# Patient Record
Sex: Female | Born: 1980
Health system: Southern US, Community
[De-identification: ages and names within clinical notes are randomized; demographics above are authoritative.]

## PROBLEM LIST (undated history)

## (undated) DIAGNOSIS — F3281 Premenstrual dysphoric disorder: Secondary | ICD-10-CM

## (undated) DIAGNOSIS — R221 Localized swelling, mass and lump, neck: Secondary | ICD-10-CM

## (undated) DIAGNOSIS — N6009 Solitary cyst of unspecified breast: Secondary | ICD-10-CM

## (undated) DIAGNOSIS — Z809 Family history of malignant neoplasm, unspecified: Secondary | ICD-10-CM

## (undated) DIAGNOSIS — N63 Unspecified lump in unspecified breast: Secondary | ICD-10-CM

## (undated) DIAGNOSIS — G4710 Hypersomnia, unspecified: Secondary | ICD-10-CM

## (undated) DIAGNOSIS — Z8639 Personal history of other endocrine, nutritional and metabolic disease: Secondary | ICD-10-CM

## (undated) DIAGNOSIS — F339 Major depressive disorder, recurrent, unspecified: Secondary | ICD-10-CM

## (undated) DIAGNOSIS — L700 Acne vulgaris: Secondary | ICD-10-CM

## (undated) DIAGNOSIS — J020 Streptococcal pharyngitis: Secondary | ICD-10-CM

## (undated) DIAGNOSIS — S62309A Unspecified fracture of unspecified metacarpal bone, initial encounter for closed fracture: Secondary | ICD-10-CM

## (undated) HISTORY — DX: Unspecified fracture of unspecified metacarpal bone, initial encounter for closed fracture: S62.309A

## (undated) HISTORY — DX: Morbid (severe) obesity due to excess calories: E66.01

## (undated) HISTORY — DX: Family history of malignant neoplasm, unspecified: Z80.9

## (undated) HISTORY — DX: Personal history of other endocrine, nutritional and metabolic disease: Z86.39

## (undated) HISTORY — DX: Streptococcal pharyngitis: J02.0

## (undated) HISTORY — DX: Acne vulgaris: L70.0

## (undated) HISTORY — DX: Hypersomnia, unspecified: G47.10

## (undated) HISTORY — DX: Localized swelling, mass and lump, neck: R22.1

## (undated) HISTORY — DX: Major depressive disorder, recurrent, unspecified: F33.9

## (undated) HISTORY — DX: Solitary cyst of unspecified breast: N60.09

## (undated) HISTORY — DX: Premenstrual dysphoric disorder: F32.81

---
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
## 2005-04-03 HISTORY — PX: BREAST BIOPSY: SHX20

## 2005-07-02 DIAGNOSIS — S62309A Unspecified fracture of unspecified metacarpal bone, initial encounter for closed fracture: Secondary | ICD-10-CM

## 2005-07-02 HISTORY — DX: Unspecified fracture of unspecified metacarpal bone, initial encounter for closed fracture: S62.309A

## 2007-05-05 DIAGNOSIS — F339 Major depressive disorder, recurrent, unspecified: Secondary | ICD-10-CM

## 2007-05-05 HISTORY — DX: Major depressive disorder, recurrent, unspecified: F33.9

## 2008-05-04 DIAGNOSIS — L700 Acne vulgaris: Secondary | ICD-10-CM

## 2008-05-04 HISTORY — DX: Acne vulgaris: L70.0

## 2010-07-22 ENCOUNTER — Encounter: Payer: Self-pay | Admitting: Family Medicine

## 2010-07-22 ENCOUNTER — Ambulatory Visit (INDEPENDENT_AMBULATORY_CARE_PROVIDER_SITE_OTHER): Payer: BC Managed Care – PPO | Admitting: Family Medicine

## 2010-07-22 ENCOUNTER — Other Ambulatory Visit (HOSPITAL_COMMUNITY)
Admission: RE | Admit: 2010-07-22 | Discharge: 2010-07-22 | Disposition: A | Discharge status: Home / Self Care | Payer: BC Managed Care – PPO | Source: Ambulatory Visit | Attending: Family Medicine | Admitting: Family Medicine

## 2010-07-22 DIAGNOSIS — Z124 Encounter for screening for malignant neoplasm of cervix: Secondary | ICD-10-CM

## 2010-07-22 DIAGNOSIS — Z01419 Encounter for gynecological examination (general) (routine) without abnormal findings: Secondary | ICD-10-CM | POA: Insufficient documentation

## 2010-07-22 DIAGNOSIS — Z1159 Encounter for screening for other viral diseases: Secondary | ICD-10-CM | POA: Insufficient documentation

## 2010-07-22 DIAGNOSIS — Z0000 Encounter for general adult medical examination without abnormal findings: Secondary | ICD-10-CM

## 2010-07-22 NOTE — Progress Notes (Addendum)
30 y/o WF here to establish care.  I see her fiance, and he recently discovered a papule on his penis that could be HPV wart, so she is here to get HPV testing with pap smear. She has never had abnl pap smear.  Previous STD testing has been negative per her report today. Her menses are incomplete since getting Mirena IUD 5 yrs ago (recently got new one 04/2010), says she gets 1-2 d of spotting only, usually a few weeks in between. Has had 2 pregnancies without any complications. Review of Systems - General ROS: negative ENT ROS: negative Breast ROS: negative for breast lumps Respiratory ROS: no cough, shortness of breath, or wheezing CV: no palps, no CP, GI: no constip, diarrhea, abd pain, GERD, or bloating EYES: no vision complaints MUSC: no joint swelling, no arthralgias or myalgias SKIN: no rashes or easy bruisability GU: no vag d/c or lesion  Past Medical History  Diagnosis Date  . Breast cyst     Needle aspiration/biopsy--benign.      History reviewed. No pertinent past surgical history.  Family History  Problem Relation Age of Onset  . Leukemia Mother     smoker  . Heart disease Maternal Grandmother   . Diabetes Maternal Grandmother   . Arthritis Maternal Grandmother     rheumatoid  . Cancer Maternal Grandfather     colon/ rectal/ kidney  . COPD Maternal Grandfather     smoker  . Huntington's disease Maternal Grandfather   . Emphysema Maternal Grandfather   . Diabetes Paternal Grandmother   . Obesity Paternal Grandmother   . Cancer Brother 10    testicular    History   Social History  . Marital Status: Divorced    Spouse Name: N/A    Number of Children: N/A  . Years of Education: N/A   Occupational History  . Not on file.   Social History Main Topics  . Smoking status: Never Smoker   . Smokeless tobacco: Never Used  . Alcohol Use: Yes     occasional  . Drug Use: No  . Sexually Active: Yes -- Female partner(s)    Birth Control/ Protection: IUD   Mirena   Other Topics Concern  . Not on file   Social History Narrative  . No narrative on file    Current Outpatient Prescriptions on File Prior to Visit  Medication Sig Dispense Refill  . levonorgestrel (MIRENA) 20 MCG/24HR IUD 1 each by Intrauterine route once.          No Known Allergies  BP 131/88  Pulse 61  Temp(Src) 98.3 F (36.8 C) (Oral)  Ht 5\' 5"  (1.651 m)  Wt 251 lb 1.9 oz (113.907 kg)  BMI 41.79 kg/m2  SpO2 98%  LMP 07/03/2010  General Appearance:    Alert, cooperative, no distress, appears stated age  Head:    Normocephalic, without obvious abnormality, atraumatic  Eyes:    PERRL, conjunctiva/corneas clear, EOM's intact, fundi    benign, both eyes  Ears:    Normal TM's and external ear canals, both ears  Nose:   Nares normal, septum midline, mucosa normal, no drainage    or sinus tenderness  Throat:   Lips, mucosa, and tongue normal; teeth and gums normal  Neck:   Supple, symmetrical, trachea midline, no adenopathy;    thyroid:  no enlargement/tenderness/nodules; no carotid   bruit or JVD  Back:     Symmetric, no curvature, ROM normal, no CVA tenderness  Lungs:  Clear to auscultation bilaterally, respirations unlabored  Chest Wall:    No tenderness or deformity   Heart:    Regular rate and rhythm, S1 and S2 normal, no murmur, rub   or gallop  Breast Exam:    No tenderness, masses, or nipple abnormality  Abdomen:     Soft, non-tender, bowel sounds active all four quadrants,    no masses, no organomegaly  Genitalia:    Normal female without lesion, discharge or tenderness  Rectal:    Normal tone, normal prostate, no masses or tenderness;   guaiac negative stool  Extremities:   Extremities normal, atraumatic, no cyanosis or edema  Pulses:   2+ and symmetric all extremities  Skin:   Skin color, texture, turgor normal, no rashes or lesions  Lymph nodes:   Cervical, supraclavicular, and axillary nodes normal  Neurologic:   CNII-XII intact, normal  strength, sensation and reflexes    throughout  Pap smear obtained today.  ASSESSMENT: 1) Gen med exam with pap.  Possible exposure to HPV--pt desires HPV testing regardless of pap result.  PLAN: Discussed prudent diet, exercise. Discussed HPV epidemiology, how it relates to genital warts and cervical changes.  Answered questions. Patient will continue to do self breast exams and we'll start routine annual clinical breast exams at age 15, with annual screening mammograms at that time as well. With strong FH of AML, will do annual CBCs---will obtain old records to review date of most recent labs and get next set when it has been a year.

## 2010-07-31 ENCOUNTER — Telehealth: Payer: Self-pay | Admitting: Family Medicine

## 2010-07-31 ENCOUNTER — Telehealth: Payer: Self-pay

## 2010-07-31 DIAGNOSIS — Z7251 High risk heterosexual behavior: Secondary | ICD-10-CM

## 2010-07-31 NOTE — Telephone Encounter (Signed)
Lab test put into system for 3.29.12 for her.

## 2010-07-31 NOTE — Telephone Encounter (Signed)
Her pap smear came back NORMAL.  However, the high risk HPV testing she requested came back positive.  Reassure her that the majority of women her age who have this CLEAR it out over the course of months/years and it does no harm.  At this point, there is nothing that needs to be done except repeat pap smear in 1 year.  Thanks.

## 2010-07-31 NOTE — Telephone Encounter (Signed)
Per MD Vertell Limber (partner) can be checked for the same tests

## 2010-07-31 NOTE — Telephone Encounter (Signed)
NO, HPV cannot be transmitted by toilet seats.   Yes, she can come get lab work tomorrow.  The STDs we can test for in the blood are syphilis and HIV.  We can also have her give a urine sample at her lab visit to do gonorrhea and chlamydia testing on.  I'll put orders in.

## 2010-07-31 NOTE — Telephone Encounter (Signed)
Pt would like to know if the HPV if transmitted by toilet or only by sex? Pt would like to know if she can come by tomorrow and get bloodwork done so she can be checked for STD's.

## 2010-07-31 NOTE — Telephone Encounter (Signed)
Pt informed

## 2010-08-01 ENCOUNTER — Other Ambulatory Visit: Payer: BC Managed Care – PPO

## 2010-08-01 DIAGNOSIS — Z7251 High risk heterosexual behavior: Secondary | ICD-10-CM

## 2010-08-02 LAB — GC/CHLAMYDIA PROBE AMP, URINE
Chlamydia, Swab/Urine, PCR: NEGATIVE
GC Probe Amp, Urine: NEGATIVE

## 2010-08-04 ENCOUNTER — Telehealth: Payer: Self-pay | Admitting: *Deleted

## 2010-08-04 NOTE — Telephone Encounter (Signed)
Message copied by Francee Piccolo on Mon Aug 04, 2010 12:06 PM ------      Message from: Nicoletta Ba      Created: Mon Aug 04, 2010  8:27 AM       Please notify: all labs came back normal.

## 2010-08-04 NOTE — Telephone Encounter (Signed)
Pt notified of results.  She is relieved.  Pt also requested results for fiance, and these were given per DPR signed by him on 02/12/10.  Pt also asks about HPV.  Pt would like to know if HPV can be passed to unborn children during birth or in utero.  Please advise.

## 2010-08-04 NOTE — Telephone Encounter (Signed)
No, HPV is not commonly passed to fetus/infant.   Tell her not to worry about this affecting her future (or current) children.

## 2010-08-05 NOTE — Telephone Encounter (Signed)
Pt notified.  She voices good understanding.

## 2010-08-19 ENCOUNTER — Encounter: Payer: Self-pay | Admitting: Family Medicine

## 2010-08-21 ENCOUNTER — Encounter: Payer: Self-pay | Admitting: Family Medicine

## 2010-08-21 DIAGNOSIS — Z8639 Personal history of other endocrine, nutritional and metabolic disease: Secondary | ICD-10-CM | POA: Insufficient documentation

## 2010-08-21 DIAGNOSIS — G4710 Hypersomnia, unspecified: Secondary | ICD-10-CM | POA: Insufficient documentation

## 2010-08-21 HISTORY — DX: Hypersomnia, unspecified: G47.10

## 2010-08-21 HISTORY — DX: Personal history of other endocrine, nutritional and metabolic disease: Z86.39

## 2010-09-03 ENCOUNTER — Encounter: Payer: Self-pay | Admitting: Family Medicine

## 2010-09-04 ENCOUNTER — Encounter: Payer: Self-pay | Admitting: Family Medicine

## 2010-11-06 ENCOUNTER — Ambulatory Visit: Payer: BC Managed Care – PPO | Admitting: Family Medicine

## 2011-02-09 ENCOUNTER — Encounter: Payer: Self-pay | Admitting: Family Medicine

## 2011-02-10 ENCOUNTER — Encounter: Payer: Self-pay | Admitting: Family Medicine

## 2011-02-10 ENCOUNTER — Ambulatory Visit (INDEPENDENT_AMBULATORY_CARE_PROVIDER_SITE_OTHER): Payer: BC Managed Care – PPO | Admitting: Family Medicine

## 2011-02-10 DIAGNOSIS — R7309 Other abnormal glucose: Secondary | ICD-10-CM

## 2011-02-10 DIAGNOSIS — F3281 Premenstrual dysphoric disorder: Secondary | ICD-10-CM | POA: Insufficient documentation

## 2011-02-10 DIAGNOSIS — E559 Vitamin D deficiency, unspecified: Secondary | ICD-10-CM

## 2011-02-10 DIAGNOSIS — N943 Premenstrual tension syndrome: Secondary | ICD-10-CM

## 2011-02-10 DIAGNOSIS — E8881 Metabolic syndrome: Secondary | ICD-10-CM

## 2011-02-10 DIAGNOSIS — R5383 Other fatigue: Secondary | ICD-10-CM

## 2011-02-10 HISTORY — DX: Premenstrual dysphoric disorder: F32.81

## 2011-02-10 MED ORDER — FLUOXETINE HCL (PMDD) 20 MG PO CAPS: ORAL_CAPSULE | ORAL | Status: DC
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
## 2011-02-10 NOTE — Assessment & Plan Note (Signed)
Start trial of fluoxetine 20mg  caps, 1 qd for 2 wks prior to her usual menses onset. Therapeutic expectations and side effect profile of medication discussed today.  Patient's questions answered.

## 2011-02-26 ENCOUNTER — Other Ambulatory Visit (INDEPENDENT_AMBULATORY_CARE_PROVIDER_SITE_OTHER): Payer: BC Managed Care – PPO

## 2011-02-26 DIAGNOSIS — R5381 Other malaise: Secondary | ICD-10-CM

## 2011-02-26 DIAGNOSIS — E88819 Insulin resistance, unspecified: Secondary | ICD-10-CM

## 2011-02-26 DIAGNOSIS — F3281 Premenstrual dysphoric disorder: Secondary | ICD-10-CM

## 2011-02-26 DIAGNOSIS — E559 Vitamin D deficiency, unspecified: Secondary | ICD-10-CM

## 2011-02-26 DIAGNOSIS — N943 Premenstrual tension syndrome: Secondary | ICD-10-CM

## 2011-02-26 DIAGNOSIS — R7309 Other abnormal glucose: Secondary | ICD-10-CM

## 2011-02-26 DIAGNOSIS — R5383 Other fatigue: Secondary | ICD-10-CM

## 2011-02-26 DIAGNOSIS — E8881 Metabolic syndrome: Secondary | ICD-10-CM

## 2011-02-26 LAB — COMPREHENSIVE METABOLIC PANEL
CO2: 25 mEq/L (ref 19–32)
Creatinine, Ser: 0.9 mg/dL (ref 0.4–1.2)
GFR: 79.78 mL/min (ref >60.00)
Glucose, Bld: 99 mg/dL (ref 70–99)
Total Bilirubin: 0.4 mg/dL (ref 0.3–1.2)

## 2011-02-26 LAB — CBC WITH DIFFERENTIAL/PLATELET
Basophils Relative: 0.5 % (ref 0.0–3.0)
Eosinophils Absolute: 0.1 10*3/uL (ref 0.0–0.7)
Eosinophils Relative: 0.9 % (ref 0.0–5.0)
HCT: 42 % (ref 36.0–46.0)
Lymphs Abs: 2.1 10*3/uL (ref 0.7–4.0)
MCHC: 34.3 g/dL (ref 30.0–36.0)
MCV: 88 fl (ref 78.0–100.0)
Monocytes Absolute: 0.4 10*3/uL (ref 0.1–1.0)
Neutro Abs: 3.7 10*3/uL (ref 1.4–7.7)
RBC: 4.77 Mil/uL (ref 3.87–5.11)
WBC: 6.4 10*3/uL (ref 4.5–10.5)

## 2011-02-26 LAB — LIPID PANEL
HDL: 48.8 mg/dL (ref >39.00)
Total CHOL/HDL Ratio: 4
Triglycerides: 154 mg/dL — ABNORMAL HIGH (ref 0.0–149.0)

## 2011-02-26 LAB — TSH: TSH: 2.12 u[IU]/mL (ref 0.35–5.50)

## 2011-02-27 ENCOUNTER — Other Ambulatory Visit: Payer: Self-pay | Admitting: Family Medicine

## 2011-02-27 LAB — VITAMIN D 25 HYDROXY (VIT D DEFICIENCY, FRACTURES): Vit D, 25-Hydroxy: 24 ng/mL — ABNORMAL LOW (ref 30–89)

## 2011-02-27 MED ORDER — ERGOCALCIFEROL 1.25 MG (50000 UT) PO CAPS: 50000.0000 [IU] | ORAL_CAPSULE | ORAL | Status: AC

## 2011-03-13 ENCOUNTER — Ambulatory Visit: Payer: BC Managed Care – PPO | Admitting: Family Medicine

## 2011-05-19 ENCOUNTER — Telehealth: Payer: Self-pay | Admitting: *Deleted

## 2011-05-19 ENCOUNTER — Other Ambulatory Visit: Payer: Self-pay | Admitting: Family Medicine

## 2011-05-19 DIAGNOSIS — F3281 Premenstrual dysphoric disorder: Secondary | ICD-10-CM

## 2011-05-19 DIAGNOSIS — R5383 Other fatigue: Secondary | ICD-10-CM

## 2011-05-19 DIAGNOSIS — E8881 Metabolic syndrome: Secondary | ICD-10-CM

## 2011-05-19 MED ORDER — FLUOXETINE HCL 20 MG PO TABS: 20.0000 mg | ORAL_TABLET | Freq: Every day | ORAL | Status: DC

## 2011-05-19 NOTE — Telephone Encounter (Signed)
Rx done--PM 

## 2011-05-19 NOTE — Telephone Encounter (Signed)
Advised refill ready.  Must have office visit prior to any additional refills.  Pt agreeable.

## 2011-05-19 NOTE — Telephone Encounter (Signed)
Pt was given 14 supply of fluoxetine on 10/9.  Cancelled follow up on 11/9.  Pt states the 14 days takes the edge off, but she notices the other days off the medication she is "edgy and depressed".  She would like 30 day supply of meds.  If OK to send I will notify pt RX done, but she needs to come in for follow up.

## 2011-12-02 ENCOUNTER — Encounter: Payer: Self-pay | Admitting: Family Medicine

## 2011-12-02 ENCOUNTER — Ambulatory Visit (INDEPENDENT_AMBULATORY_CARE_PROVIDER_SITE_OTHER): Payer: BC Managed Care – PPO | Admitting: Family Medicine

## 2011-12-02 VITALS — BP 117/82 | HR 68 | Temp 97.6°F | Ht 65.0 in | Wt 252.0 lb

## 2011-12-02 DIAGNOSIS — B029 Zoster without complications: Secondary | ICD-10-CM

## 2011-12-02 MED ORDER — ACYCLOVIR 800 MG PO TABS: ORAL_TABLET | ORAL | Status: DC
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
## 2011-12-09 ENCOUNTER — Other Ambulatory Visit: Payer: Self-pay | Admitting: Obstetrics and Gynecology

## 2012-04-06 ENCOUNTER — Encounter: Payer: Self-pay | Admitting: Family Medicine

## 2012-04-06 ENCOUNTER — Ambulatory Visit (INDEPENDENT_AMBULATORY_CARE_PROVIDER_SITE_OTHER): Payer: BC Managed Care – PPO | Admitting: Family Medicine

## 2012-04-06 VITALS — BP 122/85 | HR 91 | Temp 99.4°F | Ht 65.0 in | Wt 252.8 lb

## 2012-04-06 DIAGNOSIS — J029 Acute pharyngitis, unspecified: Secondary | ICD-10-CM

## 2012-04-06 DIAGNOSIS — J020 Streptococcal pharyngitis: Secondary | ICD-10-CM | POA: Insufficient documentation

## 2012-04-06 HISTORY — DX: Streptococcal pharyngitis: J02.0

## 2012-04-06 MED ORDER — PROBIOTIC PO CAPS: ORAL_CAPSULE | ORAL | Status: DC

## 2012-04-06 MED ORDER — AMOXICILLIN 500 MG PO CAPS: 500.0000 mg | ORAL_CAPSULE | Freq: Three times a day (TID) | ORAL | Status: DC

## 2012-04-06 MED ORDER — METHYLPREDNISOLONE ACETATE 40 MG/ML IJ SUSP
20.0000 mg | Freq: Once | INTRAMUSCULAR | Status: AC
  Administered 2012-04-06: 20 mg via INTRAMUSCULAR

## 2012-04-06 NOTE — Assessment & Plan Note (Signed)
Rapid strep positive, started on Amoxicillin 500 mg po tid and encouraged to start a probiotic, increase rest and hydration and due to amount of swelling in oropharynx is given a shot of Depo Medrol 20 mg IM in office

## 2012-04-06 NOTE — Patient Instructions (Addendum)
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
## 2013-04-13 ENCOUNTER — Other Ambulatory Visit: Payer: Self-pay | Admitting: Obstetrics and Gynecology

## 2013-04-18 ENCOUNTER — Encounter: Payer: Self-pay | Admitting: *Deleted

## 2013-04-18 ENCOUNTER — Ambulatory Visit (INDEPENDENT_AMBULATORY_CARE_PROVIDER_SITE_OTHER): Payer: No Typology Code available for payment source | Admitting: Nurse Practitioner

## 2013-04-18 VITALS — BP 120/70 | HR 109 | Temp 103.1°F | Ht 65.0 in | Wt 253.8 lb

## 2013-04-18 DIAGNOSIS — J111 Influenza due to unidentified influenza virus with other respiratory manifestations: Secondary | ICD-10-CM

## 2013-04-18 MED ORDER — BENZONATATE 100 MG PO CAPS: ORAL_CAPSULE | ORAL | Status: DC

## 2013-04-18 NOTE — Progress Notes (Signed)
Pre-visit discussion using our clinic review tool. No additional management support is needed unless otherwise documented below in the visit note.  

## 2013-04-18 NOTE — Patient Instructions (Signed)
You likely have flu. The average duration is 5-10 days. Treatment is largely symptom management.  For sinus congestion, start daily sinus rinses (neilmed Sinus Rinse) & 30 mg to 60 mg pseudoephedrine twice daily.  For sore throat use benzocaine throat lozenges or spray.  For aches & fever alternate tylenol & ibuprophen every 4-6 hours.  For cough, you may use a spoonful of honey thinned with lemon juice or hot tea, or benzonatate capsules as prescribed. Sip fluids every hour. Rest. If you are not feeling better in 1 week or develop fever or chest pain, call us for re-evaluation. Feel better!  Influenza A (H1N1) H1N1 formerly called "swine flu" is a new influenza virus causing sickness in people. The H1N1 virus is different from seasonal influenza viruses. However, the H1N1 symptoms are similar to seasonal influenza and it is spread from person to person. You may be at higher risk for serious problems if you have underlying serious medical conditions. The CDC and the World Health Organization are following reported cases around the world. CAUSES   The flu is thought to spread mainly person-to-person through coughing or sneezing of infected people.  A person may become infected by touching something with the virus on it and then touching their mouth or nose. SYMPTOMS   Fever.  Headache.  Tiredness.  Cough.  Sore throat.  Runny or stuffy nose.  Body aches.  Diarrhea and vomiting These symptoms are referred to as "flu-like symptoms." A lot of different illnesses, including the common cold, may have similar symptoms. DIAGNOSIS   There are tests that can tell if you have the H1N1 virus.  Confirmed cases of H1N1 will be reported to the state or local health department.  A doctor's exam may be needed to tell whether you have an infection that is a complication of the flu. HOME CARE INSTRUCTIONS   Stay informed. Visit the CDC website for current recommendations. Visit  www.cdc.gov/H1N1flu/. You may also call 1-800-CDC-INFO (1-800-232-4636).  Get help early if you develop any of the above symptoms.  If you are at high risk from complications of the flu, talk to your caregiver as soon as you develop flu-like symptoms. Those at higher risk for complications include:  People 65 years or older.  People with chronic medical conditions.  Pregnant women.  Young children.  Your caregiver may recommend antiviral medicine to help treat the flu.  If you get the flu, get plenty of rest, drink enough water and fluids to keep your urine clear or pale yellow, and avoid using alcohol or tobacco.  You may take over-the-counter medicine to relieve the symptoms of the flu if your caregiver approves. (Never give aspirin to children or teenagers who have flu-like symptoms, particularly fever). TREATMENT  If you do get sick, antiviral drugs are available. These drugs can make your illness milder and make you feel better faster. Treatment should start soon after illness starts. It is only effective if taken within the first day of becoming ill. Only your caregiver can prescribe antiviral medication.  PREVENTION   Cover your nose and mouth with a tissue or your arm when you cough or sneeze. Throw the tissue away.  Wash your hands often with soap and warm water, especially after you cough or sneeze. Alcohol-based cleaners are also effective against germs.  Avoid touching your eyes, nose or mouth. This is one way germs spread.  Try to avoid contact with sick people. Follow public health advice regarding school closures. Avoid crowds.  Stay   home if you get sick. Limit contact with others to keep from infecting them. People infected with the H1N1 virus may be able to infect others anywhere from 1 day before feeling sick to 5-7 days after getting flu symptoms.  An H1N1 vaccine is available to help protect against the virus. In addition to the H1N1 vaccine, you will need to be  vaccinated for seasonal influenza. The H1N1 and seasonal vaccines may be given on the same day. The CDC especially recommends the H1N1 vaccine for:  Pregnant women.  People who live with or care for children younger than 6 months of age.  Health care and emergency services personnel.  Persons between the ages of 6 months through 24 years of age.  People from ages 25 through 64 years who are at higher risk for H1N1 because of chronic health disorders or immune system problems. FACEMASKS In community and home settings, the use of facemasks and N95 respirators are not normally recommended. In certain circumstances, a facemask or N95 respirator may be used for persons at increased risk of severe illness from influenza. Your caregiver can give additional recommendations for facemask use. IN CHILDREN, EMERGENCY WARNING SIGNS THAT NEED URGENT MEDICAL CARE:  Fast breathing or trouble breathing.  Bluish skin color.  Not drinking enough fluids.  Not waking up or not interacting normally.  Being so fussy that the child does not want to be held.  Your child has an oral temperature above 102 F (38.9 C), not controlled by medicine.  Your baby is older than 3 months with a rectal temperature of 102 F (38.9 C) or higher.  Your baby is 3 months old or younger with a rectal temperature of 100.4 F (38 C) or higher.  Flu-like symptoms improve but then return with fever and worse cough. IN ADULTS, EMERGENCY WARNING SIGNS THAT NEED URGENT MEDICAL CARE:  Difficulty breathing or shortness of breath.  Pain or pressure in the chest or abdomen.  Sudden dizziness.  Confusion.  Severe or persistent vomiting.  Bluish color.  You have a oral temperature above 102 F (38.9 C), not controlled by medicine.  Flu-like symptoms improve but return with fever and worse cough. SEEK IMMEDIATE MEDICAL CARE IF:  You or someone you know is experiencing any of the above symptoms. When you arrive at the  emergency center, report that you think you have the flu. You may be asked to wear a mask and/or sit in a secluded area to protect others from getting sick. MAKE SURE YOU:   Understand these instructions.  Will watch your condition.  Will get help right away if you are not doing well or get worse. Some of this information courtesy of the CDC.  Document Released: 10/07/2007 Document Revised: 07/13/2011 Document Reviewed: 10/07/2007 [employer] Patient Information 2014 [employer]. 

## 2013-04-18 NOTE — Progress Notes (Signed)
   Subjective:    Patient ID: [place], female    DOB: 03-10-1981, 32 y.o.   MRN: [id number]  Fever  This is a new problem. The current episode started yesterday. The problem occurs constantly. The maximum temperature noted was 103 to 103.9 F. The temperature was taken using an oral thermometer. Associated symptoms include congestion, coughing, headaches, muscle aches and a sore throat. Pertinent negatives include no abdominal pain, chest pain, diarrhea, nausea, rash, vomiting or wheezing. She has tried nothing for the symptoms. The treatment provided moderate relief.      Review of Systems  Constitutional: Positive for fever, chills and fatigue. Negative for activity change and appetite change.  HENT: Positive for congestion and sore throat.   Respiratory: Positive for cough. Negative for chest tightness, shortness of breath and wheezing.   Cardiovascular: Negative for chest pain.  Gastrointestinal: Negative for nausea, vomiting, abdominal pain and diarrhea.  Musculoskeletal: Negative for back pain and neck pain.  Skin: Negative for rash.  Neurological: Positive for headaches.       Objective:   Physical Exam  Vitals reviewed. Constitutional: She is oriented to person, place, and time. She appears well-developed and well-nourished. No distress.  Looks tired  HENT:  Head: Normocephalic and atraumatic.  Right Ear: External ear normal.  Left Ear: External ear normal.  Mouth/Throat: Oropharynx is clear and moist. No oropharyngeal exudate.  Eyes: Conjunctivae are normal. Right eye exhibits no discharge. Left eye exhibits no discharge.  Neck: Normal range of motion. Neck supple. No thyromegaly present.  Cardiovascular: Normal rate, regular rhythm and normal heart sounds.   No murmur heard. Pulmonary/Chest: Effort normal and breath sounds normal. No respiratory distress. She has no wheezes.  Lymphadenopathy:    She has no cervical adenopathy.  Neurological: She is alert and  oriented to person, place, and time.  Skin: Skin is warm and dry.  Psychiatric: She has a normal mood and affect. Her behavior is normal. Thought content normal.          Assessment & Plan:  1. Influenza See pt instructions. Pt was offered tamiflu, she declined. - benzonatate (TESSALON) 100 MG capsule; Take 1-2 capsules po up to 3 times daily PRN cough  Dispense: 60 capsule; Refill: 0

## 2013-09-18 ENCOUNTER — Ambulatory Visit (INDEPENDENT_AMBULATORY_CARE_PROVIDER_SITE_OTHER): Payer: No Typology Code available for payment source | Admitting: Family Medicine

## 2013-09-18 ENCOUNTER — Encounter: Payer: Self-pay | Admitting: Family Medicine

## 2013-09-18 VITALS — BP 130/84 | HR 104 | Temp 100.2°F | Resp 18 | Wt 254.0 lb

## 2013-09-18 DIAGNOSIS — J069 Acute upper respiratory infection, unspecified: Secondary | ICD-10-CM

## 2013-09-18 DIAGNOSIS — J029 Acute pharyngitis, unspecified: Secondary | ICD-10-CM

## 2013-09-18 NOTE — Progress Notes (Signed)
OFFICE NOTE  09/18/2013  CC:  Chief Complaint  Patient presents with  . Allergies    x 1 week  . Eye Drainage  . Cough     HPI: Patient is a 33 y.o. Caucasian female who is here for sore throat. One wk history of PND/throat feeling full in evenings, some ST lately.   Eyes gunky in mornings last 2d, some redness noted in mornings lately.  Used vysine recently. Took claritin-no help.  Generic allergy/cold med tried after this and it was helfpul.  No nasal sprays. No fever except the one noted today while here. No HA's or face pain.  Ears feel clogged.     Pertinent PMH:  Past surgical, social, and family history reviewed and no changes noted since last office visit.  MEDS:  Outpatient Prescriptions Prior to Visit  Medication Sig Dispense Refill  . levonorgestrel (MIRENA) 20 MCG/24HR IUD 1 each by Intrauterine route once.        . benzonatate (TESSALON) 100 MG capsule Take 1-2 capsules po up to 3 times daily PRN cough  60 capsule  0  . PROBIOTIC CAPS Take whenever taking antibiotics, consider Digestive Advantage by [employer]       No facility-administered medications prior to visit.    PE: Blood pressure 130/84, pulse 104, temperature 100.2 F (37.9 C), temperature source Temporal, resp. rate 18, weight 254 lb (115.214 kg), SpO2 96.00%. VS: noted-low grade fever Gen: alert, NAD, WELL APPEARING. HEENT: eyes without injection, drainage, or swelling.  Ears: EACs clear, TMs with normal light reflex and landmarks.  Nose: Clear rhinorrhea, with some dried, crusty exudate adherent to mildly injected mucosa.  No purulent d/c.  No paranasal sinus TTP.  No facial swelling.  Throat and mouth without focal lesion.  No pharyngial swelling, erythema, or exudate.   Neck: supple, no LAD.   LUNGS: CTA bilat, nonlabored resps.   CV: RRR, no m/r/g. EXT: no c/c/e SKIN: no rash  LAB: rapid strep neg  IMPRESSION AND PLAN:  Viral URI/pharyngitis. Send throat culture. Symptomatic care with  tylenol, otc cold meds. Signs/symptoms to call or return for were reviewed and pt expressed understanding.  An After Visit Summary was printed and given to the patient.  FOLLOW UP: prn

## 2013-09-18 NOTE — Progress Notes (Signed)
Pre visit review using our clinic review tool, if applicable. No additional management support is needed unless otherwise documented below in the visit note. 

## 2013-09-20 LAB — CULTURE, GROUP A STREP: Organism ID, Bacteria: NORMAL

## 2013-09-28 ENCOUNTER — Encounter: Payer: Self-pay | Admitting: Nurse Practitioner

## 2013-09-28 ENCOUNTER — Telehealth: Payer: Self-pay | Admitting: Family Medicine

## 2013-09-28 ENCOUNTER — Ambulatory Visit (INDEPENDENT_AMBULATORY_CARE_PROVIDER_SITE_OTHER): Payer: No Typology Code available for payment source | Admitting: Nurse Practitioner

## 2013-09-28 VITALS — BP 144/88 | HR 71 | Temp 98.1°F | Resp 18 | Ht 65.0 in | Wt 255.0 lb

## 2013-09-28 DIAGNOSIS — J029 Acute pharyngitis, unspecified: Secondary | ICD-10-CM

## 2013-09-28 MED ORDER — LIDOCAINE VISCOUS 2 % MT SOLN: 10.0000 mL | OROMUCOSAL | Status: DC | PRN

## 2013-09-28 NOTE — Patient Instructions (Signed)
Please start sinus rinse daily ( Neilmed sinus rinse). Salt water gargle several times daily-1/4 tsp salt mixed w/1/2 c warm water-helps pull out exudate. Listerene gargles twice daily-decreases biofilm. You may use benzocaine throat lozenges and lidocaine gargle for comfort: spit out lidocaine, do not swallow. We will call with culture results.  Tonsillitis Tonsillitis is an infection of the throat that causes the tonsils to become red, tender, and swollen. Tonsils are collections of lymphoid tissue at the back of the throat. Each tonsil has crevices (crypts). Tonsils help fight nose and throat infections and keep infection from spreading to other parts of the body for the first 18 months of life.  CAUSES Sudden (acute) tonsillitis is usually caused by infection with streptococcal bacteria. Long-lasting (chronic) tonsillitis occurs when the crypts of the tonsils become filled with pieces of food and bacteria, which makes it easy for the tonsils to become repeatedly infected. SYMPTOMS  Symptoms of tonsillitis include:  A sore throat, with possible difficulty swallowing.  White patches on the tonsils.  Fever.  Tiredness.  New episodes of snoring during sleep, when you did not snore before.  Small, foul-smelling, yellowish-white pieces of material (tonsilloliths) that you occasionally cough up or spit out. The tonsilloliths can also cause you to have bad breath. DIAGNOSIS Tonsillitis can be diagnosed through a physical exam. Diagnosis can be confirmed with the results of lab tests, including a throat culture. TREATMENT  The goals of tonsillitis treatment include the reduction of the severity and duration of symptoms and prevention of associated conditions. Symptoms of tonsillitis can be improved with the use of steroids to reduce the swelling. Tonsillitis caused by bacteria can be treated with antibiotics. Usually, treatment with antibiotics is started before the cause of the tonsillitis is  known. However, if it is determined that the cause is not bacterial, antibiotics will not treat the tonsillitis. If attacks of tonsillitis are severe and frequent, your caregiver may recommend surgery to remove the tonsils (tonsillectomy). HOME CARE INSTRUCTIONS   Rest as much as possible and get plenty of sleep.  Drink plenty of fluids. While the throat is very sore, eat soft foods or liquids, such as sherbet, soups, or instant breakfast drinks.  Eat frozen ice pops.  Gargle with a warm or cold liquid to help soothe the throat. Mix 1/4 teaspoon of salt and 1/4 teaspoon of baking soda in in 8 oz of water. SEEK MEDICAL CARE IF:   Large, tender lumps develop in your neck.  A rash develops.  A green, yellow-brown, or bloody substance is coughed up.  You are unable to swallow liquids or food for 24 hours.  You notice that only one of the tonsils is swollen. SEEK IMMEDIATE MEDICAL CARE IF:   You develop any new symptoms such as vomiting, severe headache, stiff neck, chest pain, or trouble breathing or swallowing.  You have severe throat pain along with drooling or voice changes.  You have severe pain, unrelieved with recommended medications.  You are unable to fully open the mouth.  You develop redness, swelling, or severe pain anywhere in the neck.  You have a fever. MAKE SURE YOU:   Understand these instructions.  Will watch your condition.  Will get help right away if you are not doing well or get worse. Document Released: 01/28/2005 Document Revised: 12/21/2012 Document Reviewed: 10/07/2012 [facility] Patient Information 2014 [place], [place].

## 2013-09-28 NOTE — Assessment & Plan Note (Signed)
Seen last week w/ sore throat. Neg rapid strep. Neg strep A DNA. Persistent throat pain, hoarse. No other symptoms. Exudate on tonsils, erythema, swollen +3. Neg rapid strep. Upper resp culture pending. Comfort measures.

## 2013-09-28 NOTE — Telephone Encounter (Signed)
Patient Information:  Caller Name: Aiylah  Phone: [phone]  Patient: [place], [place]  Gender: Female  DOB: [birth year]  Age: 33 Years  PCP: Earley Favor [facility])  Pregnant: No  Office Follow Up:  Does the office need to follow up with this patient?: No  Instructions For The Office: N/A  RN Note:  will make appt  Symptoms  Reason For Call & Symptoms: Sore throat; swelling on L side throat. Feels it is tender to touch on outside of throat (neck) in that area also.  States sxs onset approx 2.5 wks ago along with cold sxs. Seen in office approx 10 days ago and had neg strep test. Diagnosed viral. All other cold sxs (drainage/congestion/fever etc) have resolved but sore throat continues and is worsening. Having more difficulty swallowing now and accidently touched back of throat with toothbrush this morning and jumped due to sudden severe pain. Husband tried to look at back of her throat but felt she couldn't effectively get her tongue down and out of the way for him to really see the throat. Still able to swallow enough liquids to maintain hydration per pt.   Reviewed Health History In EMR: Yes  Reviewed Medications In EMR: Yes  Reviewed Allergies In EMR: Yes  Reviewed Surgeries / Procedures: Yes  Date of Onset of Symptoms: 09/11/2013  Treatments Tried: Claritin  Treatments Tried Worked: No OB / GYN:  LMP: 09/10/2013  Guideline(s) Used:  Sore Throat  Disposition Per Guideline:   See Today in Office  Reason For Disposition Reached:   Severe sore throat pain  Advice Given:  N/A  Patient Will Follow Care Advice:  YES  Appointment Scheduled:  09/28/2013 14:00:00 Appointment Scheduled Provider:  Maximino Sarin

## 2013-09-28 NOTE — Telephone Encounter (Signed)
Noted pt has appt with Layne at 2:00 pm 09/28/13.

## 2013-09-28 NOTE — Progress Notes (Signed)
   Subjective:    Patient ID: [place], female    DOB: [birth year], 33 y.o.   MRN: [id number]  Sore Throat  This is a new problem. The current episode started 1 to 4 weeks ago (2 weeks). The problem has been unchanged. The pain is worse on the left side. There has been no fever. The pain is moderate. Associated symptoms include congestion and swollen glands. Pertinent negatives include no abdominal pain, coughing, diarrhea, ear pain, headaches, shortness of breath, trouble swallowing or vomiting. She has had no exposure to strep. She has tried nothing for the symptoms.      Review of Systems  Constitutional: Negative for fever, chills, activity change, appetite change and fatigue.  HENT: Positive for congestion, postnasal drip, sore throat and voice change. Negative for ear pain and trouble swallowing.   Respiratory: Negative for cough, chest tightness and shortness of breath.   Gastrointestinal: Negative for vomiting, abdominal pain and diarrhea.  Musculoskeletal: Negative for arthralgias, back pain and myalgias.  Skin: Negative for rash.  Neurological: Negative for headaches.       Objective:   Physical Exam  Vitals reviewed. Constitutional: She is oriented to person, place, and time. She appears well-developed and well-nourished. No distress.  HENT:  Head: Normocephalic and atraumatic.  Right Ear: External ear normal.  Left Ear: External ear normal.  Nose: Nose normal.  Mouth/Throat: Oropharyngeal exudate present.  Cryptic tonsils. +3 . Erythema. L slightly larger than right. Exudate.  Eyes: Conjunctivae are normal. Right eye exhibits no discharge. Left eye exhibits no discharge.  Neck: Normal range of motion. Neck supple. No thyromegaly present.  Cardiovascular: Normal rate, regular rhythm and normal heart sounds.   No murmur heard. Pulmonary/Chest: Effort normal and breath sounds normal. No respiratory distress. She has no wheezes. She has no rales.  Abdominal: Soft. She  exhibits no distension and no mass. There is no tenderness. There is no rebound and no guarding.  Lymphadenopathy:    She has cervical adenopathy (bilat anterior LAD, L worse than R).  Neurological: She is alert and oriented to person, place, and time.  Skin: Skin is warm and dry.  Psychiatric: She has a normal mood and affect. Her behavior is normal. Thought content normal.          Assessment & Plan:  1. Sore throat DD: tonsillitis, viral, bacterial - Upper Respiratory Culture-pending - POCT rapid strep A-neg - lidocaine (XYLOCAINE) 2 % solution; Use as directed 10 mLs in the mouth or throat as needed for mouth pain. Gargle & spit.  Dispense: 100 mL; Refill: 0 See instructions for comfort measures.

## 2013-10-01 LAB — CULTURE, UPPER RESPIRATORY

## 2013-10-02 ENCOUNTER — Telehealth: Payer: Self-pay | Admitting: Nurse Practitioner

## 2013-10-02 MED ORDER — AMOXICILLIN-POT CLAVULANATE 875-125 MG PO TABS: 1.0000 | ORAL_TABLET | Freq: Two times a day (BID) | ORAL | Status: DC

## 2013-10-02 NOTE — Telephone Encounter (Signed)
Spoke with pt, advised lab results. Pt understood and advised Rx sent to her pharmacy.

## 2013-10-02 NOTE — Telephone Encounter (Signed)
LMOM to CB. 

## 2013-10-02 NOTE — Telephone Encounter (Signed)
Upper resp Cx is growing group A beta-hemolytic strep. Will Tx w/7 days augmentin.

## 2013-10-12 ENCOUNTER — Telehealth: Payer: Self-pay | Admitting: *Deleted

## 2013-10-12 NOTE — Telephone Encounter (Signed)
Please let her know that every sore throat can be a different infection--although they all may feel the same to the patient, and while some are strep throat, the majority are from viruses and require no antibiotics.  As far as being busy this weekend, she should be just as concerned about going around a bunch of people with a viral infection as she should be with strep.  --thx

## 2013-10-12 NOTE — Telephone Encounter (Signed)
Patient stated that she will keep her appt 10/13/13 at 11am.

## 2013-10-12 NOTE — Telephone Encounter (Signed)
Caller: [place]/Patient; Phone: [phone]; Reason for Call: Patient is calling to follow up with office.  She states she spoke with Nurse Elon Jester earlier today and has not heard back .  She reports she had Strep A and has completed all antibiotics.  After being off the medication 3 days her symptoms returned.  She called to leave a message with Dr.  Marvel Plan.  Waiting on call back. REVIEWED EPIC- and advised caller that Dr. Marvel Plan apologizes but he is unable to call in Rx and she would need to be evaluated.  Patient states she is frustrated. She has ALREADY BEEN TO OFFICE TWICE.  She is currently driving back to [place] and will arrive home tonight.  She is requesting medication since she has already had to pay for two visits. She is requesting a call back.  PLEASE CONTACT.

## 2013-10-12 NOTE — Telephone Encounter (Signed)
Spoke to patient who is very upset that she has to come back into office again. I explained to patient that it is our policy to see patient's before giving medications. Patient understood however she stated that she has a lot going on this weekend and doesn't want to be around people if she has strep.

## 2013-10-12 NOTE — Telephone Encounter (Signed)
Tell her I'm sorry, but I cannot do this.  Needs to see an MD where she is currently.-thx

## 2013-10-12 NOTE — Telephone Encounter (Signed)
Patient called office requesting antibiotic for strep throat. Patient is in [place] on vacation and is having same symptoms as her last visit when she was diagnosis with strep. Patient was wanting to know if you would be willing to send her something in to a pharmacy near her. Patient would like a call back on her cell #.

## 2013-10-13 ENCOUNTER — Ambulatory Visit: Payer: No Typology Code available for payment source | Admitting: Nurse Practitioner

## 2013-10-13 ENCOUNTER — Encounter: Payer: Self-pay | Admitting: Nurse Practitioner

## 2013-10-13 ENCOUNTER — Ambulatory Visit (INDEPENDENT_AMBULATORY_CARE_PROVIDER_SITE_OTHER): Payer: No Typology Code available for payment source | Admitting: Nurse Practitioner

## 2013-10-13 VITALS — BP 125/85 | HR 91 | Temp 98.7°F | Ht 65.0 in | Wt 253.0 lb

## 2013-10-13 DIAGNOSIS — B9789 Other viral agents as the cause of diseases classified elsewhere: Secondary | ICD-10-CM

## 2013-10-13 DIAGNOSIS — J028 Acute pharyngitis due to other specified organisms: Principal | ICD-10-CM

## 2013-10-13 DIAGNOSIS — J020 Streptococcal pharyngitis: Secondary | ICD-10-CM

## 2013-10-13 DIAGNOSIS — J3501 Chronic tonsillitis: Secondary | ICD-10-CM

## 2013-10-13 DIAGNOSIS — J029 Acute pharyngitis, unspecified: Secondary | ICD-10-CM

## 2013-10-13 DIAGNOSIS — Z809 Family history of malignant neoplasm, unspecified: Secondary | ICD-10-CM

## 2013-10-13 HISTORY — DX: Family history of malignant neoplasm, unspecified: Z80.9

## 2013-10-13 LAB — CBC WITH DIFFERENTIAL/PLATELET
BASOS ABS: 0 10*3/uL (ref 0.0–0.1)
Basophils Relative: 0.9 % (ref 0.0–3.0)
Eosinophils Absolute: 0.1 10*3/uL (ref 0.0–0.7)
Eosinophils Relative: 1.7 % (ref 0.0–5.0)
HEMATOCRIT: 42.3 % (ref 36.0–46.0)
HEMOGLOBIN: 14.3 g/dL (ref 12.0–15.0)
LYMPHS ABS: 1.9 10*3/uL (ref 0.7–4.0)
Lymphocytes Relative: 33.8 % (ref 12.0–46.0)
MCHC: 33.9 g/dL (ref 30.0–36.0)
MCV: 88 fl (ref 78.0–100.0)
MONO ABS: 0.7 10*3/uL (ref 0.1–1.0)
MONOS PCT: 12.7 % — AB (ref 3.0–12.0)
NEUTROS ABS: 2.8 10*3/uL (ref 1.4–7.7)
Neutrophils Relative %: 50.9 % (ref 43.0–77.0)
Platelets: 223 10*3/uL (ref 150.0–400.0)
RBC: 4.8 Mil/uL (ref 3.87–5.11)
RDW: 12.7 % (ref 11.5–15.5)
WBC: 5.5 10*3/uL (ref 4.0–10.5)

## 2013-10-13 LAB — COMPREHENSIVE METABOLIC PANEL
ALK PHOS: 55 U/L (ref 39–117)
ALT: 22 U/L (ref 0–35)
AST: 21 U/L (ref 0–37)
Albumin: 4 g/dL (ref 3.5–5.2)
BILIRUBIN TOTAL: 0.6 mg/dL (ref 0.2–1.2)
BUN: 12 mg/dL (ref 6–23)
CO2: 27 meq/L (ref 19–32)
CREATININE: 0.9 mg/dL (ref 0.4–1.2)
Calcium: 9.5 mg/dL (ref 8.4–10.5)
Chloride: 104 mEq/L (ref 96–112)
GFR: 78.38 mL/min (ref >60.00)
Glucose, Bld: 89 mg/dL (ref 70–99)
Potassium: 4.5 mEq/L (ref 3.5–5.1)
Sodium: 137 mEq/L (ref 135–145)
Total Protein: 7.3 g/dL (ref 6.0–8.3)

## 2013-10-13 LAB — SEDIMENTATION RATE: SED RATE: 15 mm/h (ref 0–22)

## 2013-10-13 MED ORDER — AMOXICILLIN-POT CLAVULANATE 875-125 MG PO TABS: 1.0000 | ORAL_TABLET | Freq: Two times a day (BID) | ORAL | Status: DC

## 2013-10-13 NOTE — Patient Instructions (Signed)
Salt water gargles & listerene gargles twice daily for at least 1 month. Continue daily sinus rinse for at least 1 month.  Start daily probiotic at lunch time: Align or Culterelle, or eat 1 cup yogurt daily for at least 3 months.  This may be viral, but start another round of augmentin.    Return in 3 weeks. If tonsils are chronically infected, you will need to see ENT.  Feel better!  Tonsillitis Tonsillitis is an infection of the throat that causes the tonsils to become red, tender, and swollen. Tonsils are collections of lymphoid tissue at the back of the throat. Each tonsil has crevices (crypts). Tonsils help fight nose and throat infections and keep infection from spreading to other parts of the body for the first 18 months of life.  CAUSES Sudden (acute) tonsillitis is usually caused by infection with streptococcal bacteria. Long-lasting (chronic) tonsillitis occurs when the crypts of the tonsils become filled with pieces of food and bacteria, which makes it easy for the tonsils to become repeatedly infected. SYMPTOMS  Symptoms of tonsillitis include:  A sore throat, with possible difficulty swallowing.  White patches on the tonsils.  Fever.  Tiredness.  New episodes of snoring during sleep, when you did not snore before.  Small, foul-smelling, yellowish-white pieces of material (tonsilloliths) that you occasionally cough up or spit out. The tonsilloliths can also cause you to have bad breath. DIAGNOSIS Tonsillitis can be diagnosed through a physical exam. Diagnosis can be confirmed with the results of lab tests, including a throat culture. TREATMENT  The goals of tonsillitis treatment include the reduction of the severity and duration of symptoms and prevention of associated conditions. Symptoms of tonsillitis can be improved with the use of steroids to reduce the swelling. Tonsillitis caused by bacteria can be treated with antibiotics. Usually, treatment with antibiotics is  started before the cause of the tonsillitis is known. However, if it is determined that the cause is not bacterial, antibiotics will not treat the tonsillitis. If attacks of tonsillitis are severe and frequent, your caregiver may recommend surgery to remove the tonsils (tonsillectomy). HOME CARE INSTRUCTIONS   Rest as much as possible and get plenty of sleep.  Drink plenty of fluids. While the throat is very sore, eat soft foods or liquids, such as sherbet, soups, or instant breakfast drinks.  Eat frozen ice pops.  Gargle with a warm or cold liquid to help soothe the throat. Mix 1/4 teaspoon of salt and 1/4 teaspoon of baking soda in in 8 oz of water. SEEK MEDICAL CARE IF:   Large, tender lumps develop in your neck.  A rash develops.  A green, yellow-brown, or bloody substance is coughed up.  You are unable to swallow liquids or food for 24 hours.  You notice that only one of the tonsils is swollen. SEEK IMMEDIATE MEDICAL CARE IF:   You develop any new symptoms such as vomiting, severe headache, stiff neck, chest pain, or trouble breathing or swallowing.  You have severe throat pain along with drooling or voice changes.  You have severe pain, unrelieved with recommended medications.  You are unable to fully open the mouth.  You develop redness, swelling, or severe pain anywhere in the neck.  You have a fever. MAKE SURE YOU:   Understand these instructions.  Will watch your condition.  Will get help right away if you are not doing well or get worse. Document Released: 01/28/2005 Document Revised: 12/21/2012 Document Reviewed: 10/07/2012 [employer] Patient Information 2014 [employer] [place].

## 2013-10-13 NOTE — Progress Notes (Signed)
Pre visit review using our clinic review tool, if applicable. No additional management support is needed unless otherwise documented below in the visit note. 

## 2013-10-13 NOTE — Progress Notes (Signed)
   Subjective:    Patient ID: Elizabeth Salas, female    DOB: 07-Oct-1980, 33 y.o.   MRN: [id number]  URI  This is a recurrent (pt was treated for non group A strep 2 wks ago. Finished ABX 5 da. Felt well. Symptom return 2 da.) problem. The current episode started in the past 7 days (3 d). The problem has been unchanged. There has been no fever. Associated symptoms include congestion, coughing, sinus pain and a sore throat. Pertinent negatives include no abdominal pain, chest pain, diarrhea, ear pain (c/o fullness), headaches, nausea, rash, sneezing, vomiting or wheezing. She has tried antihistamine for the symptoms. The treatment provided no relief.      Review of Systems  Constitutional: Positive for chills and fatigue. Negative for fever, activity change and appetite change.  HENT: Positive for congestion, postnasal drip, sinus pressure and sore throat. Negative for ear pain (c/o fullness), sneezing and voice change.   Eyes: Negative for redness.  Respiratory: Positive for cough. Negative for chest tightness and wheezing.   Cardiovascular: Negative for chest pain.  Gastrointestinal: Negative for nausea, vomiting, abdominal pain and diarrhea.  Musculoskeletal: Negative for back pain.  Skin: Negative for rash.  Neurological: Negative for headaches.       Objective:   Physical Exam  Vitals reviewed. Constitutional: She is oriented to person, place, and time. She appears well-developed and well-nourished. No distress.  HENT:  Head: Normocephalic and atraumatic.  Right Ear: External ear and ear canal normal. Tympanic membrane is injected. Tympanic membrane is not retracted. No middle ear effusion.  Left Ear: External ear and ear canal normal. Tympanic membrane is injected. Tympanic membrane is not retracted.  No middle ear effusion.  Mouth/Throat: Uvula swelling present. Oropharyngeal exudate (cryptic tonsils, tonsiloliths), posterior oropharyngeal edema and posterior oropharyngeal erythema  present. No tonsillar abscesses.  Eyes: Conjunctivae are normal. Right eye exhibits no discharge. Left eye exhibits no discharge.  Neck: Normal range of motion. Neck supple. No thyromegaly present.  Cardiovascular: Normal rate, regular rhythm and normal heart sounds.   No murmur heard. Pulmonary/Chest: Effort normal and breath sounds normal. No respiratory distress. She has no wheezes. She has no rales.  Lymphadenopathy:    She has no cervical adenopathy.  Neurological: She is alert and oriented to person, place, and time.  Skin: Skin is warm.  Psychiatric: She has a normal mood and affect. Her behavior is normal. Thought content normal.          Assessment & Plan:  1. Sore throat (viral) Recurrent. Tonsillitis, tonsiloliths. Just finished augmentin Strong fam Hx ca (mother, brother, MGF) - CBC with Differential - Comprehensive metabolic panel - Sedimentation rate - Upper Respiratory Culture - POCT rapid strep A  2. Chronic tonsillitis - CBC with Differential - Comprehensive metabolic panel - Sedimentation rate - amoxicillin-clavulanate (AUGMENTIN) 875-125 MG per tablet; Take 1 tablet by mouth 2 (two) times daily.  Dispense: 14 tablet; Refill: 0 Sinus rinse, salt water gargles, listerene gargles, probiotics. See pt instructions.

## 2013-10-16 ENCOUNTER — Telehealth: Payer: Self-pay | Admitting: Nurse Practitioner

## 2013-10-16 LAB — CULTURE, UPPER RESPIRATORY: Organism ID, Bacteria: NORMAL

## 2013-10-16 NOTE — Telephone Encounter (Signed)
pls call pt: Advise Blood work nml. Still waiting on throat culture. Continue w/instructions given in ofc.

## 2013-10-16 NOTE — Telephone Encounter (Signed)
Patient notified of results.

## 2014-07-11 ENCOUNTER — Other Ambulatory Visit: Payer: Self-pay | Admitting: Family Medicine

## 2014-07-11 DIAGNOSIS — Z0000 Encounter for general adult medical examination without abnormal findings: Secondary | ICD-10-CM

## 2014-07-13 ENCOUNTER — Other Ambulatory Visit (INDEPENDENT_AMBULATORY_CARE_PROVIDER_SITE_OTHER): Payer: 59

## 2014-07-13 DIAGNOSIS — Z0000 Encounter for general adult medical examination without abnormal findings: Secondary | ICD-10-CM

## 2014-07-13 LAB — LIPID PANEL
Cholesterol: 213 mg/dL — ABNORMAL HIGH (ref 0–200)
HDL: 50.6 mg/dL (ref >39.00)
LDL Cholesterol: 128 mg/dL — ABNORMAL HIGH (ref 0–99)
NONHDL: 162.4
Total CHOL/HDL Ratio: 4
Triglycerides: 170 mg/dL — ABNORMAL HIGH (ref 0.0–149.0)
VLDL: 34 mg/dL (ref 0.0–40.0)

## 2014-07-13 LAB — COMPREHENSIVE METABOLIC PANEL
ALT: 18 U/L (ref 0–35)
AST: 15 U/L (ref 0–37)
Albumin: 4.5 g/dL (ref 3.5–5.2)
Alkaline Phosphatase: 56 U/L (ref 39–117)
BUN: 18 mg/dL (ref 6–23)
CALCIUM: 9.8 mg/dL (ref 8.4–10.5)
CO2: 31 meq/L (ref 19–32)
CREATININE: 0.98 mg/dL (ref 0.40–1.20)
Chloride: 102 mEq/L (ref 96–112)
GFR: 69.8 mL/min (ref >60.00)
Glucose, Bld: 80 mg/dL (ref 70–99)
Potassium: 4.5 mEq/L (ref 3.5–5.1)
Sodium: 137 mEq/L (ref 135–145)
Total Bilirubin: 0.6 mg/dL (ref 0.2–1.2)
Total Protein: 7.7 g/dL (ref 6.0–8.3)

## 2014-07-13 LAB — CBC WITH DIFFERENTIAL/PLATELET
BASOS PCT: 1.5 % (ref 0.0–3.0)
Basophils Absolute: 0.1 10*3/uL (ref 0.0–0.1)
EOS PCT: 1.1 % (ref 0.0–5.0)
Eosinophils Absolute: 0.1 10*3/uL (ref 0.0–0.7)
HEMATOCRIT: 44 % (ref 36.0–46.0)
Hemoglobin: 14.9 g/dL (ref 12.0–15.0)
LYMPHS ABS: 2.5 10*3/uL (ref 0.7–4.0)
Lymphocytes Relative: 34.9 % (ref 12.0–46.0)
MCHC: 33.8 g/dL (ref 30.0–36.0)
MCV: 86.8 fl (ref 78.0–100.0)
MONO ABS: 0.5 10*3/uL (ref 0.1–1.0)
MONOS PCT: 6.9 % (ref 3.0–12.0)
NEUTROS PCT: 55.6 % (ref 43.0–77.0)
Neutro Abs: 4 10*3/uL (ref 1.4–7.7)
Platelets: 242 10*3/uL (ref 150.0–400.0)
RBC: 5.07 Mil/uL (ref 3.87–5.11)
RDW: 12.5 % (ref 11.5–15.5)
WBC: 7.2 10*3/uL (ref 4.0–10.5)

## 2014-07-13 LAB — TSH: TSH: 2.31 u[IU]/mL (ref 0.35–4.50)

## 2014-07-20 ENCOUNTER — Encounter: Payer: Self-pay | Admitting: Family Medicine

## 2014-07-20 ENCOUNTER — Ambulatory Visit (INDEPENDENT_AMBULATORY_CARE_PROVIDER_SITE_OTHER): Payer: 59 | Admitting: Family Medicine

## 2014-07-20 VITALS — BP 133/87 | HR 85 | Temp 97.4°F | Ht 60.0 in | Wt 270.0 lb

## 2014-07-20 DIAGNOSIS — Z0000 Encounter for general adult medical examination without abnormal findings: Secondary | ICD-10-CM

## 2014-07-20 NOTE — Assessment & Plan Note (Signed)
Reviewed age and gender appropriate health maintenance issues (prudent diet, regular exercise, health risks of tobacco and excessive alcohol, use of seatbelts, fire alarms in home, use of sunscreen).  Also reviewed age and gender appropriate health screening as well as vaccine recommendations. Reviewed HP labs in detail, all were good. No vaccines needed. Next pelvic/pap due with GYN MD after 04/2016. Regarding her recent episodes that sound like hypoglycemia, I reassured her, recommended she continue to eat approx 6 small meals a day, monitor for recurrence of sx's and if they begin again i recommended she call and let me know and I will rx a glucometer for her to use to check her glucose at the time of her symptoms. I think her peri-oral symptoms are a result of subtle hyperventilation that patient does not even realize she is doing.   Pt agreeable to this plan.
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
## 2014-07-20 NOTE — Progress Notes (Signed)
Pre visit review using our clinic review tool, if applicable. No additional management support is needed unless otherwise documented below in the visit note. 

## 2014-08-03 ENCOUNTER — Other Ambulatory Visit: Payer: Self-pay | Admitting: Obstetrics and Gynecology

## 2014-08-06 ENCOUNTER — Encounter: Payer: Self-pay | Admitting: *Deleted

## 2014-08-06 LAB — CYTOLOGY - PAP

## 2014-08-07 ENCOUNTER — Encounter: Payer: Self-pay | Admitting: Family Medicine

## 2014-10-03 ENCOUNTER — Telehealth: Payer: Self-pay | Admitting: Family Medicine

## 2014-10-03 NOTE — Telephone Encounter (Signed)
Left message for pt to call back  °

## 2014-10-03 NOTE — Telephone Encounter (Signed)
Returned [employer] call. Please call back to [phone] Pt has tick in freezer if you need to see it.

## 2014-10-03 NOTE — Telephone Encounter (Signed)
Elizabeth Salas was bit by a tick last evening (the tick had a white spot on it's back). She was wondering if there is anything she should be concerned about? It is red around bite but not swollen or painful./dh

## 2014-10-03 NOTE — Telephone Encounter (Signed)
Per Dr. Milinda CaveMcGowen pt should be seen if she has any of the following: severe HA, N/V, body aches or pain at the site. Some redness and itching is normal. Pt advised and voiced understanding.

## 2015-04-24 HISTORY — PX: INTRAUTERINE DEVICE (IUD) INSERTION: SHX5877

## 2015-04-25 ENCOUNTER — Encounter: Payer: Self-pay | Admitting: Family Medicine

## 2015-08-14 ENCOUNTER — Encounter: Payer: Self-pay | Admitting: Family Medicine

## 2015-08-14 NOTE — Telephone Encounter (Signed)
Please advise. Thanks.  

## 2016-01-28 ENCOUNTER — Encounter: Payer: Self-pay | Admitting: Family Medicine

## 2016-01-28 ENCOUNTER — Ambulatory Visit (INDEPENDENT_AMBULATORY_CARE_PROVIDER_SITE_OTHER): Payer: 59 | Admitting: Family Medicine

## 2016-01-28 VITALS — BP 132/76 | HR 72 | Temp 98.5°F | Resp 20 | Wt 275.8 lb

## 2016-01-28 DIAGNOSIS — M2669 Other specified disorders of temporomandibular joint: Secondary | ICD-10-CM | POA: Diagnosis not present

## 2016-01-28 DIAGNOSIS — T148 Other injury of unspecified body region: Secondary | ICD-10-CM | POA: Diagnosis not present

## 2016-01-28 DIAGNOSIS — T148XXA Other injury of unspecified body region, initial encounter: Secondary | ICD-10-CM

## 2016-01-28 MED ORDER — METHYLPREDNISOLONE ACETATE 80 MG/ML IJ SUSP
80.0000 mg | Freq: Once | INTRAMUSCULAR | Status: AC
  Administered 2016-01-28: 80 mg via INTRAMUSCULAR

## 2016-01-28 MED ORDER — CYCLOBENZAPRINE HCL 5 MG PO TABS: 5.0000 mg | ORAL_TABLET | Freq: Three times a day (TID) | ORAL | 0 refills | Status: DC | PRN

## 2016-01-28 MED ORDER — NAPROXEN 500 MG PO TABS: ORAL_TABLET | ORAL | 0 refills | Status: DC
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
## 2016-01-28 NOTE — Patient Instructions (Signed)
Naproxen 500 mg every 12 hours for 7 days, then as needed. stay hydrated.  Flexeril every night (if you can tolerate every 8 hours is ok) for 7 days, then as needed.  Heating pad use to neck.  No chewing tough foods steaks, beef jerky, taffy etc for at least a month.  If worsening follow up sooner, otherwise 2-4 weeks.    Temporomandibular Joint Syndrome Temporomandibular joint (TMJ) syndrome is a condition that affects the joints between your jaw and your skull. The TMJs are located near your ears and allow your jaw to open and close. These joints and the nearby muscles are involved in all movements of the jaw. People with TMJ syndrome have pain in the area of these joints and muscles. Chewing, biting, or other movements of the jaw can be difficult or painful. TMJ syndrome can be caused by various things. In many cases, the condition is mild and goes away within a few weeks. For some people, the condition can become a long-term problem. CAUSES Possible causes of TMJ syndrome include:  Grinding your teeth or clenching your jaw. Some people do this when they are under stress.  Arthritis.  Injury to the jaw.  Head or neck injury.  Teeth or dentures that are not aligned well. In some cases, the cause of TMJ syndrome may not be known. SIGNS AND SYMPTOMS The most common symptom is an aching pain on the side of the head in the area of the TMJ. Other symptoms may include:  Pain when moving your jaw, such as when chewing or biting.  Being unable to open your jaw all the way.  Making a clicking sound when you open your mouth.  Headache.  Earache.  Neck or shoulder pain. DIAGNOSIS Diagnosis can usually be made based on your symptoms, your medical history, and a physical exam. Your health care provider may check the range of motion of your jaw. Imaging tests, such as X-rays or an MRI, are sometimes done. You may need to see your dentist to determine if your teeth and jaw are lined up  correctly. TREATMENT TMJ syndrome often goes away on its own. If treatment is needed, the options may include:  Eating soft foods and applying ice or heat.  Medicines to relieve pain or inflammation.  Medicines to relax the muscles.  A splint, bite plate, or mouthpiece to prevent teeth grinding or jaw clenching.  Relaxation techniques or counseling to help reduce stress.  Transcutaneous electrical nerve stimulation (TENS). This helps to relieve pain by applying an electrical current through the skin.  Acupuncture. This is sometimes helpful to relieve pain.  Jaw surgery. This is rarely needed. HOME CARE INSTRUCTIONS  Take medicines only as directed by your health care provider.  Eat a soft diet if you are having trouble chewing.  Apply ice to the painful area.  Put ice in a plastic bag.  Place a towel between your skin and the bag.  Leave the ice on for 20 minutes, 2-3 times a day.  Apply a warm compress to the painful area as directed.  Massage your jaw area and perform any jaw stretching exercises as recommended by your health care provider.  If you were given a mouthpiece or bite plate, wear it as directed.  Avoid foods that require a lot of chewing. Do not chew gum.  Keep all follow-up visits as directed by your health care provider. This is important. SEEK MEDICAL CARE IF:  You are having trouble eating.  You  have new or worsening symptoms. SEEK IMMEDIATE MEDICAL CARE IF:  Your jaw locks open or closed.   This information is not intended to replace advice given to you by your health care provider. Make sure you discuss any questions you have with your health care provider.   Document Released: 01/13/2001 Document Revised: 05/11/2014 Document Reviewed: 11/23/2013 Elsevier Interactive Patient Education [employer]2016 [employer]

## 2016-02-26 ENCOUNTER — Ambulatory Visit (INDEPENDENT_AMBULATORY_CARE_PROVIDER_SITE_OTHER): Payer: 59 | Admitting: Family Medicine

## 2016-02-26 ENCOUNTER — Encounter: Payer: Self-pay | Admitting: Family Medicine

## 2016-02-26 VITALS — BP 116/84 | HR 71 | Temp 98.0°F | Resp 20 | Ht 60.0 in | Wt 274.8 lb

## 2016-02-26 DIAGNOSIS — S0340XD Sprain of jaw, unspecified side, subsequent encounter: Secondary | ICD-10-CM

## 2016-02-26 NOTE — Patient Instructions (Signed)
I am glad you are feeling better.  Remember to stay away from chewy or hard to bite into objects. Use medications as needed on the onset of discomfort.   Temporomandibular Joint Syndrome Temporomandibular joint (TMJ) syndrome is a condition that affects the joints between your jaw and your skull. The TMJs are located near your ears and allow your jaw to open and close. These joints and the nearby muscles are involved in all movements of the jaw. People with TMJ syndrome have pain in the area of these joints and muscles. Chewing, biting, or other movements of the jaw can be difficult or painful. TMJ syndrome can be caused by various things. In many cases, the condition is mild and goes away within a few weeks. For some people, the condition can become a long-term problem. CAUSES Possible causes of TMJ syndrome include:  Grinding your teeth or clenching your jaw. Some people do this when they are under stress.  Arthritis.  Injury to the jaw.  Head or neck injury.  Teeth or dentures that are not aligned well. In some cases, the cause of TMJ syndrome may not be known. SIGNS AND SYMPTOMS The most common symptom is an aching pain on the side of the head in the area of the TMJ. Other symptoms may include:  Pain when moving your jaw, such as when chewing or biting.  Being unable to open your jaw all the way.  Making a clicking sound when you open your mouth.  Headache.  Earache.  Neck or shoulder pain. DIAGNOSIS Diagnosis can usually be made based on your symptoms, your medical history, and a physical exam. Your health care provider may check the range of motion of your jaw. Imaging tests, such as X-rays or an MRI, are sometimes done. You may need to see your dentist to determine if your teeth and jaw are lined up correctly. TREATMENT TMJ syndrome often goes away on its own. If treatment is needed, the options may include:  Eating soft foods and applying ice or heat.  Medicines to  relieve pain or inflammation.  Medicines to relax the muscles.  A splint, bite plate, or mouthpiece to prevent teeth grinding or jaw clenching.  Relaxation techniques or counseling to help reduce stress.  Transcutaneous electrical nerve stimulation (TENS). This helps to relieve pain by applying an electrical current through the skin.  Acupuncture. This is sometimes helpful to relieve pain.  Jaw surgery. This is rarely needed. HOME CARE INSTRUCTIONS  Take medicines only as directed by your health care provider.  Eat a soft diet if you are having trouble chewing.  Apply ice to the painful area.  Put ice in a plastic bag.  Place a towel between your skin and the bag.  Leave the ice on for 20 minutes, 2-3 times a day.  Apply a warm compress to the painful area as directed.  Massage your jaw area and perform any jaw stretching exercises as recommended by your health care provider.  If you were given a mouthpiece or bite plate, wear it as directed.  Avoid foods that require a lot of chewing. Do not chew gum.  Keep all follow-up visits as directed by your health care provider. This is important. SEEK MEDICAL CARE IF:  You are having trouble eating.  You have new or worsening symptoms. SEEK IMMEDIATE MEDICAL CARE IF:  Your jaw locks open or closed.   This information is not intended to replace advice given to you by your health care provider.  Make sure you discuss any questions you have with your health care provider.   Document Released: 01/13/2001 Document Revised: 05/11/2014 Document Reviewed: 11/23/2013 Elsevier Interactive Patient Education [employer]2016 Elsevier Inc.
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
## 2016-03-16 ENCOUNTER — Other Ambulatory Visit: Payer: Self-pay | Admitting: Family Medicine

## 2016-03-16 DIAGNOSIS — Z0000 Encounter for general adult medical examination without abnormal findings: Secondary | ICD-10-CM

## 2016-04-06 ENCOUNTER — Other Ambulatory Visit (INDEPENDENT_AMBULATORY_CARE_PROVIDER_SITE_OTHER): Payer: 59

## 2016-04-06 DIAGNOSIS — R7989 Other specified abnormal findings of blood chemistry: Secondary | ICD-10-CM | POA: Diagnosis not present

## 2016-04-06 DIAGNOSIS — Z0000 Encounter for general adult medical examination without abnormal findings: Secondary | ICD-10-CM

## 2016-04-06 LAB — CBC WITH DIFFERENTIAL/PLATELET
Basophils Absolute: 0.1 10*3/uL (ref 0.0–0.1)
Basophils Relative: 0.8 % (ref 0.0–3.0)
EOS PCT: 0.9 % (ref 0.0–5.0)
Eosinophils Absolute: 0.1 10*3/uL (ref 0.0–0.7)
HEMATOCRIT: 41.5 % (ref 36.0–46.0)
HEMOGLOBIN: 14.1 g/dL (ref 12.0–15.0)
LYMPHS PCT: 35.7 % (ref 12.0–46.0)
Lymphs Abs: 2.5 10*3/uL (ref 0.7–4.0)
MCHC: 33.9 g/dL (ref 30.0–36.0)
MCV: 88.3 fl (ref 78.0–100.0)
Monocytes Absolute: 0.5 10*3/uL (ref 0.1–1.0)
Monocytes Relative: 7.2 % (ref 3.0–12.0)
Neutro Abs: 3.8 10*3/uL (ref 1.4–7.7)
Neutrophils Relative %: 55.4 % (ref 43.0–77.0)
Platelets: 222 10*3/uL (ref 150.0–400.0)
RBC: 4.7 Mil/uL (ref 3.87–5.11)
RDW: 12.7 % (ref 11.5–15.5)
WBC: 6.9 10*3/uL (ref 4.0–10.5)

## 2016-04-06 LAB — COMPREHENSIVE METABOLIC PANEL
ALK PHOS: 50 U/L (ref 39–117)
ALT: 18 U/L (ref 0–35)
AST: 14 U/L (ref 0–37)
Albumin: 4.2 g/dL (ref 3.5–5.2)
BILIRUBIN TOTAL: 0.4 mg/dL (ref 0.2–1.2)
BUN: 23 mg/dL (ref 6–23)
CALCIUM: 9.2 mg/dL (ref 8.4–10.5)
CO2: 24 mEq/L (ref 19–32)
Chloride: 107 mEq/L (ref 96–112)
Creatinine, Ser: 0.83 mg/dL (ref 0.40–1.20)
GFR: 83.66 mL/min (ref >60.00)
GLUCOSE: 98 mg/dL (ref 70–99)
Potassium: 4.3 mEq/L (ref 3.5–5.1)
Sodium: 140 mEq/L (ref 135–145)
TOTAL PROTEIN: 6.8 g/dL (ref 6.0–8.3)

## 2016-04-06 LAB — LIPID PANEL
CHOLESTEROL: 191 mg/dL (ref 0–200)
HDL: 46.6 mg/dL (ref >39.00)
NONHDL: 144.77
TRIGLYCERIDES: 275 mg/dL — AB (ref 0.0–149.0)
Total CHOL/HDL Ratio: 4
VLDL: 55 mg/dL — ABNORMAL HIGH (ref 0.0–40.0)

## 2016-04-06 LAB — TSH: TSH: 2.95 u[IU]/mL (ref 0.35–4.50)

## 2016-04-06 LAB — LDL CHOLESTEROL, DIRECT: Direct LDL: 102 mg/dL

## 2016-04-08 ENCOUNTER — Ambulatory Visit (INDEPENDENT_AMBULATORY_CARE_PROVIDER_SITE_OTHER): Payer: 59 | Admitting: Family Medicine

## 2016-04-08 ENCOUNTER — Encounter: Payer: Self-pay | Admitting: Family Medicine

## 2016-04-08 VITALS — BP 129/85 | HR 80 | Temp 98.6°F | Resp 16 | Ht 65.0 in | Wt 278.5 lb

## 2016-04-08 DIAGNOSIS — Z0000 Encounter for general adult medical examination without abnormal findings: Secondary | ICD-10-CM | POA: Diagnosis not present
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
## 2016-04-08 NOTE — Progress Notes (Signed)
Pre visit review using our clinic review tool, if applicable. No additional management support is needed unless otherwise documented below in the visit note. 

## 2016-07-29 DIAGNOSIS — N6310 Unspecified lump in the right breast, unspecified quadrant: Secondary | ICD-10-CM | POA: Diagnosis not present

## 2016-07-30 ENCOUNTER — Encounter: Payer: Self-pay | Admitting: Family Medicine

## 2016-07-31 ENCOUNTER — Other Ambulatory Visit: Payer: Self-pay | Admitting: Obstetrics & Gynecology

## 2016-07-31 DIAGNOSIS — N6310 Unspecified lump in the right breast, unspecified quadrant: Secondary | ICD-10-CM

## 2016-08-03 ENCOUNTER — Ambulatory Visit: Payer: 59

## 2016-08-03 ENCOUNTER — Other Ambulatory Visit: Payer: 59

## 2016-08-18 ENCOUNTER — Ambulatory Visit
Admission: RE | Admit: 2016-08-18 | Discharge: 2016-08-18 | Disposition: A | Discharge status: Home / Self Care | Payer: 59 | Source: Ambulatory Visit | Attending: Obstetrics & Gynecology | Admitting: Obstetrics & Gynecology

## 2016-08-18 DIAGNOSIS — N63 Unspecified lump in unspecified breast: Secondary | ICD-10-CM

## 2016-08-18 DIAGNOSIS — R928 Other abnormal and inconclusive findings on diagnostic imaging of breast: Secondary | ICD-10-CM | POA: Diagnosis not present

## 2016-08-18 DIAGNOSIS — N6310 Unspecified lump in the right breast, unspecified quadrant: Secondary | ICD-10-CM

## 2016-08-18 DIAGNOSIS — N6489 Other specified disorders of breast: Secondary | ICD-10-CM | POA: Diagnosis not present

## 2016-08-18 HISTORY — DX: Unspecified lump in unspecified breast: N63.0

## 2018-02-24 ENCOUNTER — Ambulatory Visit: Payer: 59 | Admitting: Family Medicine

## 2018-02-25 ENCOUNTER — Ambulatory Visit: Payer: 59 | Admitting: Family Medicine

## 2018-02-25 DIAGNOSIS — Z0289 Encounter for other administrative examinations: Secondary | ICD-10-CM
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
## 2018-02-28 ENCOUNTER — Encounter: Payer: Self-pay | Admitting: Family Medicine

## 2018-02-28 ENCOUNTER — Ambulatory Visit: Payer: 59 | Admitting: Family Medicine

## 2018-02-28 VITALS — BP 129/84 | HR 74 | Temp 98.3°F | Resp 16 | Ht 65.5 in | Wt 292.2 lb

## 2018-02-28 DIAGNOSIS — M722 Plantar fascial fibromatosis: Secondary | ICD-10-CM | POA: Diagnosis not present

## 2018-02-28 NOTE — Patient Instructions (Signed)
Take three over the counter generic ibuprofen with breakfast and with supper for 10 days.
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
## 2018-03-21 DIAGNOSIS — Z124 Encounter for screening for malignant neoplasm of cervix: Secondary | ICD-10-CM | POA: Diagnosis not present

## 2018-03-21 DIAGNOSIS — N939 Abnormal uterine and vaginal bleeding, unspecified: Secondary | ICD-10-CM | POA: Diagnosis not present

## 2018-03-23 ENCOUNTER — Encounter: Payer: Self-pay | Admitting: *Deleted

## 2018-03-24 ENCOUNTER — Encounter: Payer: Self-pay | Admitting: Family Medicine

## 2018-03-24 ENCOUNTER — Ambulatory Visit (INDEPENDENT_AMBULATORY_CARE_PROVIDER_SITE_OTHER): Payer: 59 | Admitting: Family Medicine

## 2018-03-24 VITALS — BP 113/78 | HR 72 | Temp 98.1°F | Resp 16 | Ht 65.5 in | Wt 290.1 lb

## 2018-03-24 DIAGNOSIS — Z0000 Encounter for general adult medical examination without abnormal findings: Secondary | ICD-10-CM | POA: Diagnosis not present

## 2018-03-24 LAB — CBC WITH DIFFERENTIAL/PLATELET
Basophils Absolute: 0.1 10*3/uL (ref 0.0–0.1)
Basophils Relative: 0.9 % (ref 0.0–3.0)
EOS ABS: 0.1 10*3/uL (ref 0.0–0.7)
Eosinophils Relative: 0.7 % (ref 0.0–5.0)
HCT: 43.4 % (ref 36.0–46.0)
HEMOGLOBIN: 14.7 g/dL (ref 12.0–15.0)
Lymphocytes Relative: 31.2 % (ref 12.0–46.0)
Lymphs Abs: 2.5 10*3/uL (ref 0.7–4.0)
MCHC: 33.9 g/dL (ref 30.0–36.0)
MCV: 88.9 fl (ref 78.0–100.0)
MONO ABS: 0.4 10*3/uL (ref 0.1–1.0)
Monocytes Relative: 5.3 % (ref 3.0–12.0)
Neutro Abs: 5 10*3/uL (ref 1.4–7.7)
Neutrophils Relative %: 61.9 % (ref 43.0–77.0)
Platelets: 278 10*3/uL (ref 150.0–400.0)
RBC: 4.88 Mil/uL (ref 3.87–5.11)
RDW: 12.8 % (ref 11.5–15.5)
WBC: 8.1 10*3/uL (ref 4.0–10.5)

## 2018-03-24 LAB — LIPID PANEL
Cholesterol: 209 mg/dL — ABNORMAL HIGH (ref 0–200)
HDL: 58.9 mg/dL (ref >39.00)
LDL Cholesterol: 120 mg/dL — ABNORMAL HIGH (ref 0–99)
NONHDL: 149.78
Total CHOL/HDL Ratio: 4
Triglycerides: 147 mg/dL (ref 0.0–149.0)
VLDL: 29.4 mg/dL (ref 0.0–40.0)

## 2018-03-24 LAB — COMPREHENSIVE METABOLIC PANEL
ALT: 34 U/L (ref 0–35)
AST: 23 U/L (ref 0–37)
Albumin: 4.4 g/dL (ref 3.5–5.2)
Alkaline Phosphatase: 55 U/L (ref 39–117)
BUN: 17 mg/dL (ref 6–23)
CHLORIDE: 102 meq/L (ref 96–112)
CO2: 28 meq/L (ref 19–32)
Calcium: 9.5 mg/dL (ref 8.4–10.5)
Creatinine, Ser: 0.85 mg/dL (ref 0.40–1.20)
GFR: 80.47 mL/min (ref >60.00)
GLUCOSE: 90 mg/dL (ref 70–99)
POTASSIUM: 4.2 meq/L (ref 3.5–5.1)
SODIUM: 139 meq/L (ref 135–145)
Total Bilirubin: 0.6 mg/dL (ref 0.2–1.2)
Total Protein: 7.2 g/dL (ref 6.0–8.3)

## 2018-03-24 LAB — TSH: TSH: 2.01 u[IU]/mL (ref 0.35–4.50)

## 2018-03-24 LAB — HM PAP SMEAR: HM PAP: NEGATIVE
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
## 2018-03-24 NOTE — Patient Instructions (Signed)

## 2018-03-25 ENCOUNTER — Encounter: Payer: Self-pay | Admitting: Family Medicine

## 2018-04-15 ENCOUNTER — Ambulatory Visit: Payer: 59 | Admitting: Family Medicine

## 2018-04-15 ENCOUNTER — Encounter: Payer: Self-pay | Admitting: Family Medicine

## 2018-04-15 VITALS — BP 131/85 | HR 90 | Temp 98.2°F | Resp 16 | Ht 65.5 in | Wt 289.2 lb

## 2018-04-15 DIAGNOSIS — J069 Acute upper respiratory infection, unspecified: Secondary | ICD-10-CM

## 2018-04-15 DIAGNOSIS — J040 Acute laryngitis: Secondary | ICD-10-CM

## 2018-04-15 DIAGNOSIS — B349 Viral infection, unspecified: Secondary | ICD-10-CM | POA: Diagnosis not present
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
## 2018-04-15 NOTE — Patient Instructions (Signed)
Get otc generic robitussin DM OR Mucinex DM and use as directed on the packaging for cough and congestion. Use otc generic saline nasal spray 2-3 times per day to irrigate/moisturize your nasal passages.   

## 2018-05-02 ENCOUNTER — Ambulatory Visit: Payer: Self-pay

## 2018-05-02 NOTE — Telephone Encounter (Signed)
Returned call to patient who states she saw Dr Milinda CaveMcGowen Dec.13th for a bad cough.  She has taken Mucinex as instructed but has had no relief.  Now her cough is dry her voice is hoarse.  She has not had fever but just can't stop coughing. Appointment scheduled per protocol.  Care advice read to patient. Pt verbalized understanding of all instructions.  Reason for Disposition . Cough has been present for > 3 weeks  Answer Assessment - Initial Assessment Questions 1. ONSET: "When did the cough begin?"      Weeks ago 2. SEVERITY: "How bad is the cough today?"      Comes and goes 3. RESPIRATORY DISTRESS: "Describe your breathing."      no 4. FEVER: "Do you have a fever?" If so, ask: "What is your temperature, how was it measured, and when did it start?"     no 5. HEMOPTYSIS: "Are you coughing up any blood?" If so ask: "How much?" (flecks, streaks, tablespoons, etc.)     no 6. TREATMENT: "What have you done so far to treat the cough?" (e.g., meds, fluids, humidifier)     mucinex muscle aching 7. CARDIAC HISTORY: "Do you have any history of heart disease?" (e.g., heart attack, congestive heart failure)      No 8. LUNG HISTORY: "Do you have any history of lung disease?"  (e.g., pulmonary embolus, asthma, emphysema)     no 9. PE RISK FACTORS: "Do you have a history of blood clots?" (or: recent major surgery, recent prolonged travel, bedridden)     no 10. OTHER SYMPTOMS: "Do you have any other symptoms? (e.g., runny nose, wheezing, chest pain)       Abdominal muscles are sore 11. PREGNANCY: "Is there any chance you are pregnant?" "When was your last menstrual period?"       No IUD 12. TRAVEL: "Have you traveled out of the country in the last month?" (e.g., travel history, exposures)       no  Protocols used: COUGH - ACUTE NON-PRODUCTIVE-A-AH

## 2018-05-06 ENCOUNTER — Ambulatory Visit (INDEPENDENT_AMBULATORY_CARE_PROVIDER_SITE_OTHER): Payer: 59 | Admitting: Family Medicine

## 2018-05-06 ENCOUNTER — Encounter: Payer: Self-pay | Admitting: Family Medicine

## 2018-05-06 VITALS — BP 127/73 | HR 83 | Temp 98.2°F | Resp 16 | Ht 65.5 in | Wt 292.2 lb

## 2018-05-06 DIAGNOSIS — R05 Cough: Secondary | ICD-10-CM

## 2018-05-06 DIAGNOSIS — J040 Acute laryngitis: Secondary | ICD-10-CM | POA: Diagnosis not present

## 2018-05-06 DIAGNOSIS — J209 Acute bronchitis, unspecified: Secondary | ICD-10-CM | POA: Diagnosis not present

## 2018-05-06 DIAGNOSIS — R058 Other specified cough: Secondary | ICD-10-CM

## 2018-05-06 MED ORDER — BENZONATATE 200 MG PO CAPS: 200.0000 mg | ORAL_CAPSULE | Freq: Three times a day (TID) | ORAL | 1 refills | Status: DC | PRN

## 2018-05-06 MED ORDER — PANTOPRAZOLE SODIUM 40 MG PO TBEC: 40.0000 mg | DELAYED_RELEASE_TABLET | Freq: Every day | ORAL | 1 refills | Status: DC

## 2018-05-06 NOTE — Patient Instructions (Signed)
Elevate the head of your bed (ideally with 6 inch  bed blocks),  Smoking cessation, avoidance of late meals, excessive alcohol, and avoid fatty foods, chocolate, peppermint, colas, red wine, and acidic juices such as orange juice.  NO MINT OR MENTHOL PRODUCTS SO NO COUGH DROPS   USE SUGARLESS CANDY INSTEAD (Jolley ranchers or Stover's or Life Savers) or even ice chips will also do - the key is to swallow to prevent all throat clearing. NO OIL BASED VITAMINS - use powdered substitutes.  Stop cough drops. Take pantoprazole 40mg  every day for 2 wks. Take generic otc flonase, 2 sprays each nostril once daily x 2 wks. Tessalon pearls 200mg  cap three times a day x 2 wks.
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
## 2019-01-04 ENCOUNTER — Telehealth: Payer: Self-pay | Admitting: Family Medicine

## 2019-01-04 NOTE — Telephone Encounter (Signed)
PCP scheduled checked and booked until next Tuesday, 9/8. Can patient be worked in?

## 2019-01-04 NOTE — Telephone Encounter (Signed)
No. Pls have her make appt when I have an opening already.  Sorry.

## 2019-01-04 NOTE — Telephone Encounter (Signed)
Patient has started having panic attacks. She would like to schedule either a virtual or in office visit with Dr. Anitra Lauth. She would prefer to see him as opposed to another provider if possible. She cannot schedule an appointment next Tuesday the 8th. Is there any other time she can be worked in?

## 2019-01-05 NOTE — Telephone Encounter (Signed)
Please schedule pt with next available. PCP unable to work her in.

## 2019-01-12 ENCOUNTER — Encounter: Payer: Self-pay | Admitting: Family Medicine

## 2019-01-19 ENCOUNTER — Ambulatory Visit (INDEPENDENT_AMBULATORY_CARE_PROVIDER_SITE_OTHER): Payer: 59 | Admitting: Family Medicine

## 2019-01-19 ENCOUNTER — Other Ambulatory Visit: Payer: Self-pay

## 2019-01-19 ENCOUNTER — Encounter: Payer: Self-pay | Admitting: Family Medicine

## 2019-01-19 VITALS — Temp 97.6°F | Wt 219.0 lb

## 2019-01-19 DIAGNOSIS — F4323 Adjustment disorder with mixed anxiety and depressed mood: Secondary | ICD-10-CM

## 2019-01-19 DIAGNOSIS — F410 Panic disorder [episodic paroxysmal anxiety] without agoraphobia: Secondary | ICD-10-CM

## 2019-01-19 MED ORDER — LORAZEPAM 0.5 MG PO TABS: ORAL_TABLET | ORAL | 0 refills | Status: AC

## 2019-01-19 MED ORDER — SERTRALINE HCL 50 MG PO TABS: ORAL_TABLET | ORAL | 0 refills | Status: DC
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
## 2019-02-13 ENCOUNTER — Ambulatory Visit: Payer: 59 | Admitting: Family Medicine

## 2019-03-23 DIAGNOSIS — N943 Premenstrual tension syndrome: Secondary | ICD-10-CM | POA: Insufficient documentation

## 2019-03-27 ENCOUNTER — Encounter: Payer: 59 | Admitting: Family Medicine

## 2019-03-27 ENCOUNTER — Other Ambulatory Visit: Payer: Self-pay

## 2019-03-28 ENCOUNTER — Ambulatory Visit (INDEPENDENT_AMBULATORY_CARE_PROVIDER_SITE_OTHER): Payer: 59 | Admitting: Family Medicine

## 2019-03-28 ENCOUNTER — Encounter: Payer: Self-pay | Admitting: Family Medicine

## 2019-03-28 VITALS — BP 106/74 | HR 62 | Temp 98.9°F | Resp 16 | Ht 65.0 in | Wt 228.0 lb

## 2019-03-28 DIAGNOSIS — Z0000 Encounter for general adult medical examination without abnormal findings: Secondary | ICD-10-CM | POA: Diagnosis not present

## 2019-03-28 DIAGNOSIS — Z6837 Body mass index (BMI) 37.0-37.9, adult: Secondary | ICD-10-CM

## 2019-03-28 DIAGNOSIS — F4323 Adjustment disorder with mixed anxiety and depressed mood: Secondary | ICD-10-CM

## 2019-03-28 DIAGNOSIS — E782 Mixed hyperlipidemia: Secondary | ICD-10-CM

## 2019-03-28 LAB — CBC WITH DIFFERENTIAL/PLATELET
Basophils Absolute: 0.1 10*3/uL (ref 0.0–0.1)
Basophils Relative: 1 % (ref 0.0–3.0)
Eosinophils Absolute: 0 10*3/uL (ref 0.0–0.7)
Eosinophils Relative: 0.7 % (ref 0.0–5.0)
HCT: 41.8 % (ref 36.0–46.0)
Hemoglobin: 13.8 g/dL (ref 12.0–15.0)
Lymphocytes Relative: 28.1 % (ref 12.0–46.0)
Lymphs Abs: 2 10*3/uL (ref 0.7–4.0)
MCHC: 33.1 g/dL (ref 30.0–36.0)
MCV: 91.4 fl (ref 78.0–100.0)
Monocytes Absolute: 0.4 10*3/uL (ref 0.1–1.0)
Monocytes Relative: 5.2 % (ref 3.0–12.0)
Neutro Abs: 4.6 10*3/uL (ref 1.4–7.7)
Neutrophils Relative %: 65 % (ref 43.0–77.0)
Platelets: 249 10*3/uL (ref 150.0–400.0)
RBC: 4.57 Mil/uL (ref 3.87–5.11)
RDW: 13.4 % (ref 11.5–15.5)
WBC: 7.1 10*3/uL (ref 4.0–10.5)

## 2019-03-28 LAB — TSH: TSH: 1.77 u[IU]/mL (ref 0.35–4.50)

## 2019-03-28 LAB — LIPID PANEL
Cholesterol: 193 mg/dL (ref 0–200)
HDL: 57.2 mg/dL (ref >39.00)
LDL Cholesterol: 113 mg/dL — ABNORMAL HIGH (ref 0–99)
NonHDL: 136.11
Total CHOL/HDL Ratio: 3
Triglycerides: 116 mg/dL (ref 0.0–149.0)
VLDL: 23.2 mg/dL (ref 0.0–40.0)

## 2019-03-28 LAB — COMPREHENSIVE METABOLIC PANEL
ALT: 16 U/L (ref 0–35)
AST: 13 U/L (ref 0–37)
Albumin: 3.9 g/dL (ref 3.5–5.2)
Alkaline Phosphatase: 41 U/L (ref 39–117)
BUN: 14 mg/dL (ref 6–23)
CO2: 26 mEq/L (ref 19–32)
Calcium: 9.1 mg/dL (ref 8.4–10.5)
Chloride: 105 mEq/L (ref 96–112)
Creatinine, Ser: 0.75 mg/dL (ref 0.40–1.20)
GFR: 86.98 mL/min (ref >60.00)
Glucose, Bld: 99 mg/dL (ref 70–99)
Potassium: 4.3 mEq/L (ref 3.5–5.1)
Sodium: 137 mEq/L (ref 135–145)
Total Bilirubin: 0.7 mg/dL (ref 0.2–1.2)
Total Protein: 6.7 g/dL (ref 6.0–8.3)
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
## 2019-04-13 ENCOUNTER — Ambulatory Visit: Payer: 59 | Admitting: Family Medicine

## 2019-04-13 ENCOUNTER — Encounter: Payer: Self-pay | Admitting: Family Medicine

## 2019-04-13 ENCOUNTER — Other Ambulatory Visit: Payer: Self-pay

## 2019-04-13 VITALS — BP 121/83 | HR 77 | Temp 97.7°F | Resp 16 | Ht 65.0 in | Wt 228.0 lb

## 2019-04-13 DIAGNOSIS — R221 Localized swelling, mass and lump, neck: Secondary | ICD-10-CM
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
## 2019-04-19 ENCOUNTER — Other Ambulatory Visit: Payer: Self-pay | Admitting: Family Medicine

## 2019-04-19 DIAGNOSIS — R221 Localized swelling, mass and lump, neck: Secondary | ICD-10-CM

## 2019-04-21 ENCOUNTER — Other Ambulatory Visit: Payer: 59

## 2019-05-03 ENCOUNTER — Ambulatory Visit
Admission: RE | Admit: 2019-05-03 | Discharge: 2019-05-03 | Disposition: A | Discharge status: Home / Self Care | Payer: 59 | Source: Ambulatory Visit | Attending: Family Medicine | Admitting: Family Medicine

## 2019-05-03 DIAGNOSIS — R221 Localized swelling, mass and lump, neck: Secondary | ICD-10-CM

## 2019-05-04 ENCOUNTER — Other Ambulatory Visit: Payer: Self-pay | Admitting: Family Medicine

## 2019-05-04 DIAGNOSIS — R221 Localized swelling, mass and lump, neck: Secondary | ICD-10-CM

## 2019-05-10 ENCOUNTER — Telehealth: Payer: Self-pay | Admitting: Family Medicine

## 2019-05-10 DIAGNOSIS — R221 Localized swelling, mass and lump, neck: Secondary | ICD-10-CM

## 2019-05-10 NOTE — Telephone Encounter (Signed)
-----   Message from Eulah Pont, CMA sent at 05/10/2019  1:12 PM EST ----- Regarding: FW: Authorization Denial  ----- Message ----- From: Debe Coder Sent: 05/10/2019  11:31 AM EST To: Stephens Shire, [facility] Subject: Authorization Denial                           Insurance denied this patient's exam.   Will the provider do a peer to peer, or should the exam be canceled?  INS:      [employer] DOS:    05/15/2019 CPT:     00525  Phone number for peer to peer request:   [phone]         Denial is in [employer]     Thank you, Debe Coder

## 2019-05-10 NOTE — Telephone Encounter (Signed)
I decided not to do peer-to-peer b/c the ultrasound result showed nothing at all. Therefore, CT may be of little benefit anyway. I talked to pt today and told her the best next step is to have ENT see her for further evaluation. I have ordered referral. Pls cancel the CT. You can close this encounter.-thx

## 2019-05-11 NOTE — Telephone Encounter (Signed)
I have cancelled the CT scan.

## 2019-05-15 ENCOUNTER — Other Ambulatory Visit: Payer: 59

## 2019-05-23 DIAGNOSIS — R221 Localized swelling, mass and lump, neck: Secondary | ICD-10-CM | POA: Insufficient documentation

## 2019-05-29 ENCOUNTER — Other Ambulatory Visit: Payer: Self-pay

## 2019-05-29 ENCOUNTER — Encounter: Payer: Self-pay | Admitting: Family Medicine

## 2019-05-29 ENCOUNTER — Telehealth: Payer: Self-pay | Admitting: Family Medicine

## 2019-05-29 ENCOUNTER — Ambulatory Visit (INDEPENDENT_AMBULATORY_CARE_PROVIDER_SITE_OTHER): Payer: 59 | Admitting: Family Medicine

## 2019-05-29 DIAGNOSIS — U071 COVID-19: Secondary | ICD-10-CM | POA: Diagnosis not present

## 2019-05-29 DIAGNOSIS — M545 Low back pain, unspecified: Secondary | ICD-10-CM

## 2019-05-29 MED ORDER — HYDROCODONE-ACETAMINOPHEN 5-325 MG PO TABS: 1.0000 | ORAL_TABLET | Freq: Four times a day (QID) | ORAL | 0 refills | Status: AC | PRN

## 2019-05-29 NOTE — Telephone Encounter (Signed)
Pt scheduled as virtual for 4pm today.

## 2019-05-29 NOTE — Telephone Encounter (Signed)
Virtual appt pls (can be today at 4)
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
## 2019-05-29 NOTE — Telephone Encounter (Signed)
Please advise, thanks.

## 2019-05-29 NOTE — Telephone Encounter (Signed)
Patient reports she is COVID positive, she is on DAY 5 of her symptoms.  She is having lower back pain which she reports is severe.  She was told this was a normal symptom. She has taken 2000mg  of Tylenol to help with pain, and is aware she cannot take this ongoing, because it can harm her. She is asking what else she can take OTC or is there anything that can be prescribed for her. Does she need a virtual appt?  Please call [phone].

## 2019-05-29 NOTE — Telephone Encounter (Signed)
LMOM for pt to CB to schedule appointment.  

## 2019-05-31 ENCOUNTER — Other Ambulatory Visit: Payer: Self-pay | Admitting: Otolaryngology

## 2019-05-31 DIAGNOSIS — R221 Localized swelling, mass and lump, neck: Secondary | ICD-10-CM

## 2019-07-26 ENCOUNTER — Ambulatory Visit
Admission: RE | Admit: 2019-07-26 | Discharge: 2019-07-26 | Disposition: A | Discharge status: Home / Self Care | Payer: 59 | Source: Ambulatory Visit | Attending: Otolaryngology | Admitting: Otolaryngology

## 2019-07-26 DIAGNOSIS — R221 Localized swelling, mass and lump, neck: Secondary | ICD-10-CM

## 2019-07-26 MED ORDER — IOPAMIDOL (ISOVUE-300) INJECTION 61%
75.0000 mL | Freq: Once | INTRAVENOUS | Status: AC | PRN
  Administered 2019-07-26: 75 mL via INTRAVENOUS

## 2020-08-20 DIAGNOSIS — F339 Major depressive disorder, recurrent, unspecified: Secondary | ICD-10-CM | POA: Diagnosis not present

## 2020-08-20 DIAGNOSIS — F411 Generalized anxiety disorder: Secondary | ICD-10-CM | POA: Diagnosis not present

## 2020-08-22 DIAGNOSIS — F419 Anxiety disorder, unspecified: Secondary | ICD-10-CM | POA: Diagnosis not present

## 2020-09-05 DIAGNOSIS — F419 Anxiety disorder, unspecified: Secondary | ICD-10-CM | POA: Diagnosis not present

## 2020-09-12 DIAGNOSIS — F411 Generalized anxiety disorder: Secondary | ICD-10-CM | POA: Diagnosis not present

## 2020-09-12 DIAGNOSIS — F339 Major depressive disorder, recurrent, unspecified: Secondary | ICD-10-CM | POA: Diagnosis not present

## 2020-09-18 DIAGNOSIS — F339 Major depressive disorder, recurrent, unspecified: Secondary | ICD-10-CM | POA: Diagnosis not present

## 2020-09-18 DIAGNOSIS — F411 Generalized anxiety disorder: Secondary | ICD-10-CM | POA: Diagnosis not present

## 2020-09-25 DIAGNOSIS — F339 Major depressive disorder, recurrent, unspecified: Secondary | ICD-10-CM | POA: Diagnosis not present

## 2020-09-25 DIAGNOSIS — F411 Generalized anxiety disorder: Secondary | ICD-10-CM | POA: Diagnosis not present

## 2020-10-19 DIAGNOSIS — H16042 Marginal corneal ulcer, left eye: Secondary | ICD-10-CM | POA: Diagnosis not present

## 2020-10-21 DIAGNOSIS — H16042 Marginal corneal ulcer, left eye: Secondary | ICD-10-CM | POA: Diagnosis not present

## 2020-10-22 DIAGNOSIS — H16042 Marginal corneal ulcer, left eye: Secondary | ICD-10-CM | POA: Diagnosis not present

## 2020-10-23 DIAGNOSIS — H16042 Marginal corneal ulcer, left eye: Secondary | ICD-10-CM | POA: Diagnosis not present

## 2020-10-24 DIAGNOSIS — H16042 Marginal corneal ulcer, left eye: Secondary | ICD-10-CM | POA: Diagnosis not present

## 2020-10-28 DIAGNOSIS — H16042 Marginal corneal ulcer, left eye: Secondary | ICD-10-CM | POA: Diagnosis not present

## 2020-10-31 DIAGNOSIS — H16042 Marginal corneal ulcer, left eye: Secondary | ICD-10-CM | POA: Diagnosis not present

## 2020-11-05 DIAGNOSIS — Z639 Problem related to primary support group, unspecified: Secondary | ICD-10-CM | POA: Diagnosis not present

## 2020-11-05 DIAGNOSIS — F411 Generalized anxiety disorder: Secondary | ICD-10-CM | POA: Diagnosis not present

## 2020-11-05 DIAGNOSIS — F339 Major depressive disorder, recurrent, unspecified: Secondary | ICD-10-CM | POA: Diagnosis not present

## 2020-11-07 DIAGNOSIS — H16042 Marginal corneal ulcer, left eye: Secondary | ICD-10-CM | POA: Diagnosis not present

## 2020-11-26 DIAGNOSIS — F339 Major depressive disorder, recurrent, unspecified: Secondary | ICD-10-CM | POA: Diagnosis not present

## 2020-11-26 DIAGNOSIS — F411 Generalized anxiety disorder: Secondary | ICD-10-CM | POA: Diagnosis not present

## 2020-11-26 DIAGNOSIS — Z639 Problem related to primary support group, unspecified: Secondary | ICD-10-CM | POA: Diagnosis not present

## 2020-12-18 DIAGNOSIS — F339 Major depressive disorder, recurrent, unspecified: Secondary | ICD-10-CM | POA: Diagnosis not present

## 2020-12-18 DIAGNOSIS — F411 Generalized anxiety disorder: Secondary | ICD-10-CM | POA: Diagnosis not present

## 2020-12-18 DIAGNOSIS — Z639 Problem related to primary support group, unspecified: Secondary | ICD-10-CM | POA: Diagnosis not present

## 2020-12-20 IMAGING — CT CT NECK W/ CM
5 of 6 series · 14 of 33 positions shown, 16 images · IV contrast (iopamidol)
Comparison: Targeted head/neck ultrasound 05/03/2019

CLINICAL DATA: Neck mass. Additional history provided by
technologist: Neck mass, nonpainful, area marked with vitamin-E
marker, headaches

EXAM:
CT NECK WITH CONTRAST
TECHNIQUE: Multidetector CT imaging of the neck was performed using the
standard protocol following the bolus administration of intravenous
contrast.
CONTRAST:  75mL [id number] IOPAMIDOL ([id number]) INJECTION 61%

[Series 2: neck 2.00 br40 s3 st/ no angle · axial · 0.48mm/px · z∈[-743,-665]mm · 2 of 119 slices shown, 3 images]
[im 40/119  soft-tissue]
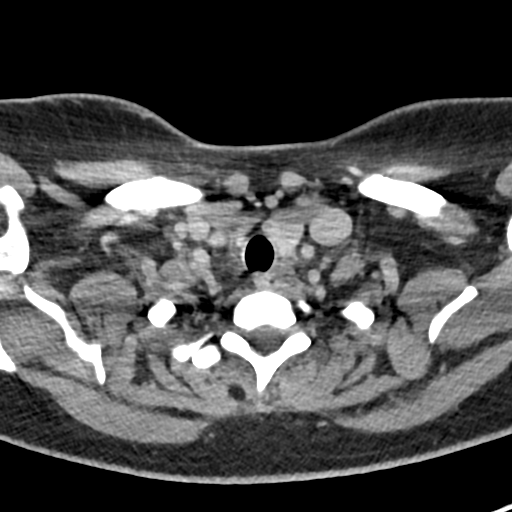
[im 40/119  bone]
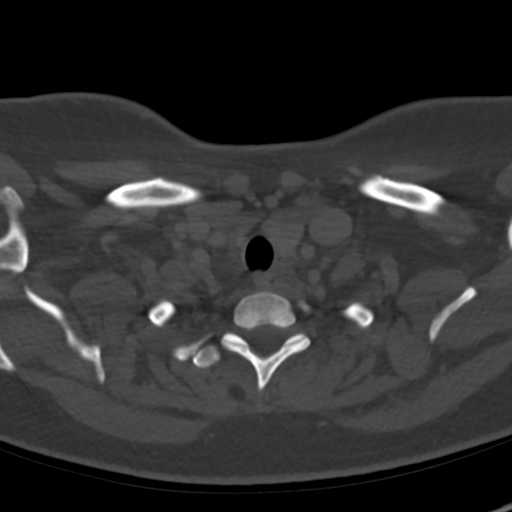
[im 79/119  bone]
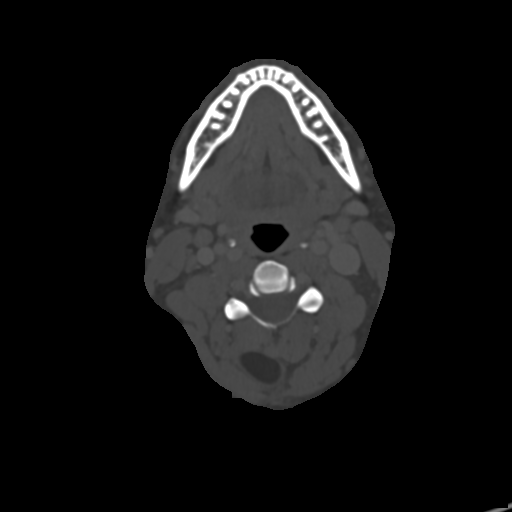

[Series 4: neck 2.00 br60 s3 bone/ no angle · axial · 0.48mm/px · z∈[-743,-665]mm · 2 of 119 slices shown]
[im 40/119  bone]
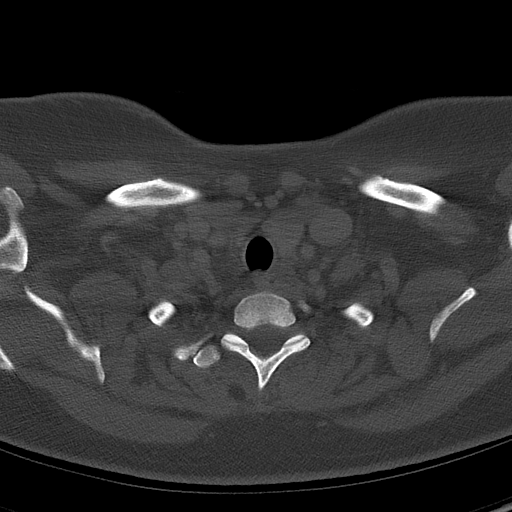
[im 79/119  bone]
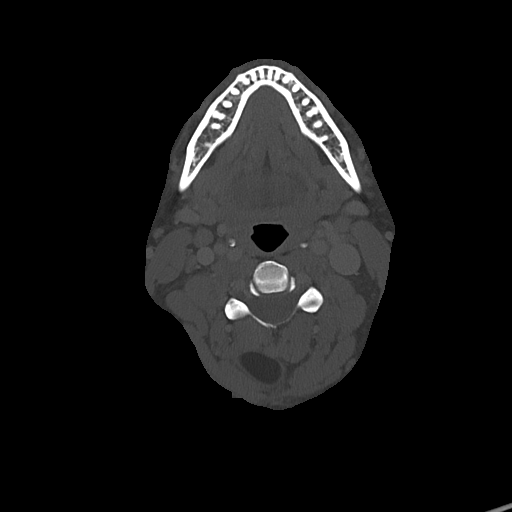

[Series 6: neck 2.00 br36 s3 angled axial (person_name) · axial · 0.48mm/px · z∈[-763,-686]mm · 2 of 119 slices shown]
[im 40/119  bone]
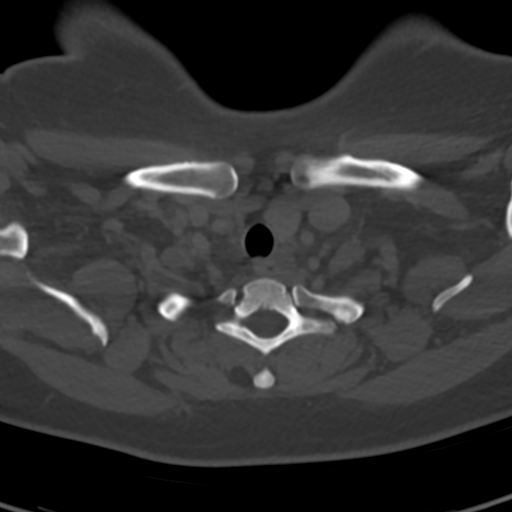
[im 79/119  bone]
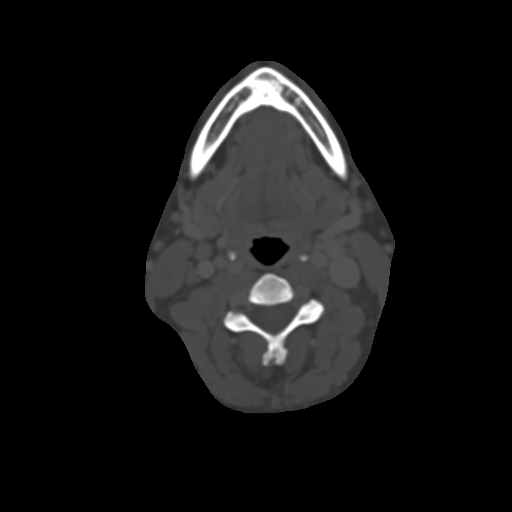

[Series 10: neck 2.00 br40 s3 (person_name) · coronal · 0.47mm/px · 3 of 123 slices shown (1 of 2)]
[im 25/123  bone]
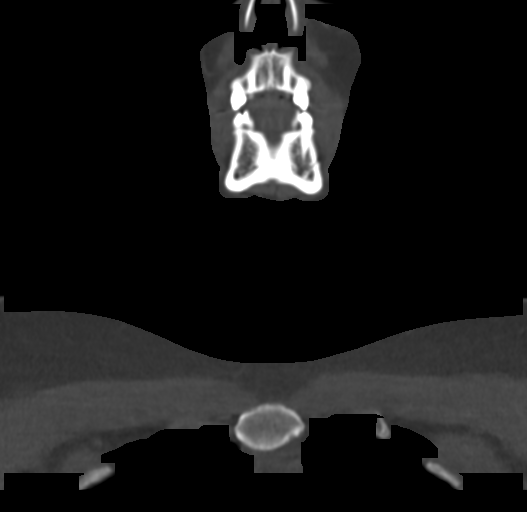
[im 49/123  bone]
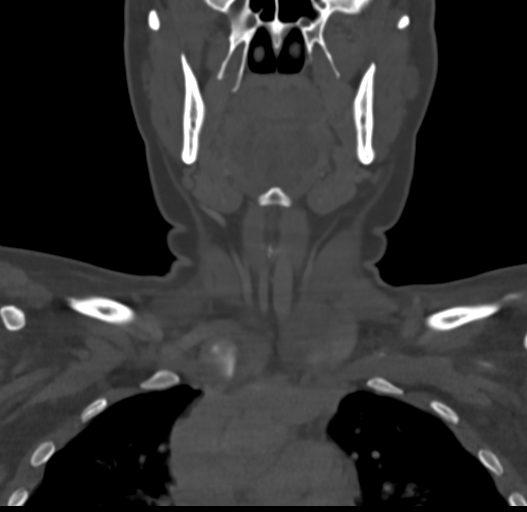
[im 74/123  bone]
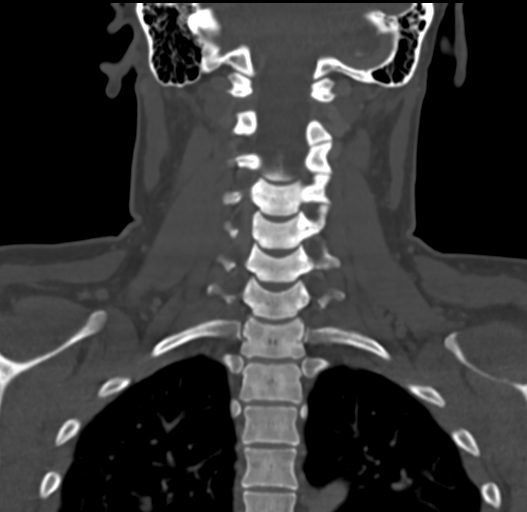

[Series 12: neck 2.00 br40 s3 (person_name) · sagittal · 0.47mm/px · 5 of 123 slices shown, 6 images (2 of 2)]
[im 41/123  bone]
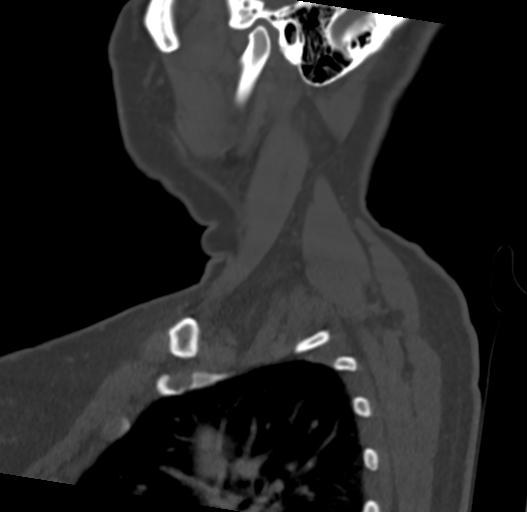
[im 51/123  bone]
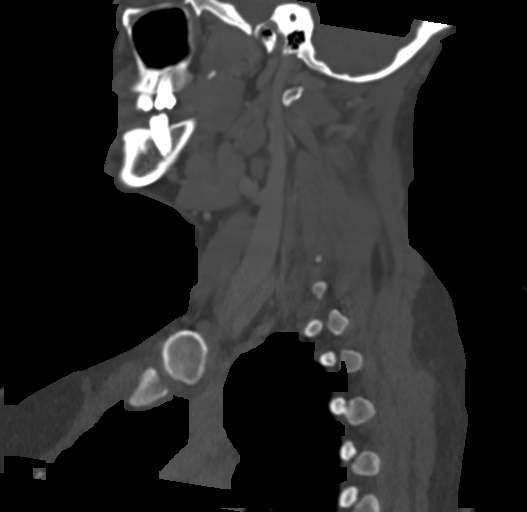
[im 62/123  soft-tissue]
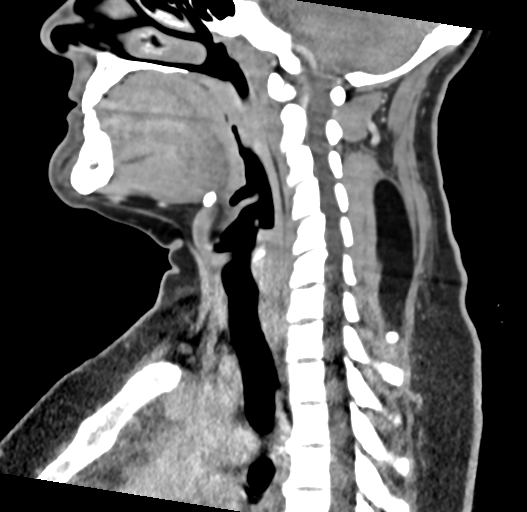
[im 62/123  bone]
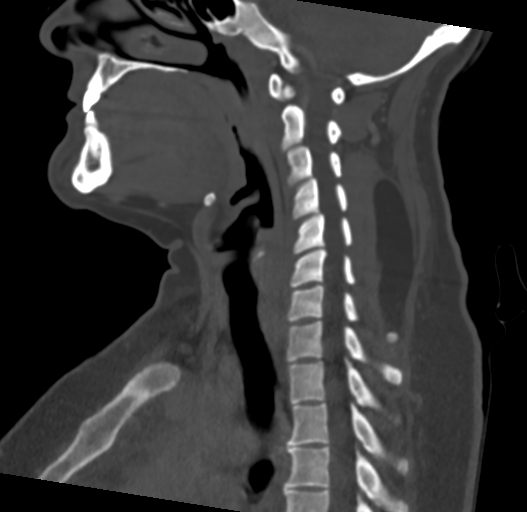
[im 72/123  bone]
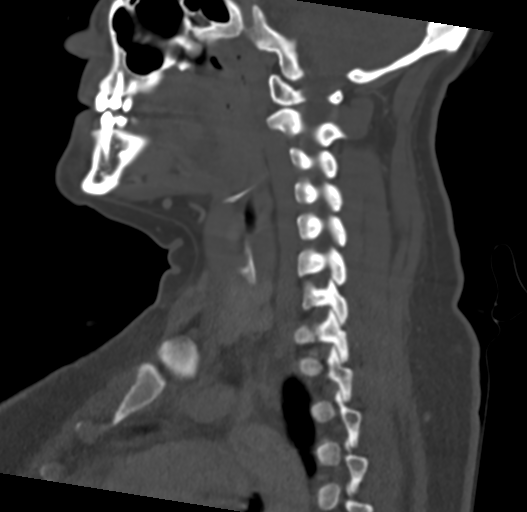
[im 82/123  bone]
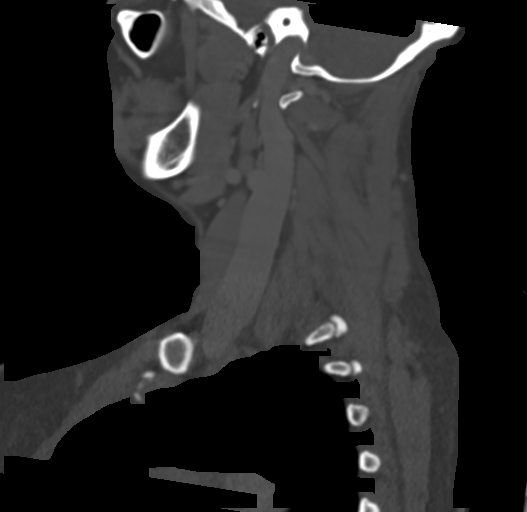

[14 of 33 positions shown; findings below may reference images not displayed]

FINDINGS: Pharynx and larynx: There is no appreciable swelling or discrete
mass within the oral cavity, pharynx or larynx. Postinflammatory
calcifications of the palatine tonsils.

Salivary glands: No inflammation, mass, or stone.

Thyroid: Unremarkable.

Lymph nodes: No pathologically enlarged cervical chain lymph nodes
are identified.

Vascular: The major vascular structures of the neck appear patent.

Limited intracranial: No abnormality identified.

Visualized orbits: Incompletely imaged. Visualized orbits
demonstrate no acute abnormality.

Mastoids and visualized paranasal sinuses: No significant paranasal
sinus disease or mastoid effusion at the imaged levels.

Skeleton: No acute bony abnormality or aggressive osseous lesion.
Trace C2-C3 anterolisthesis. C5-C6 and C6-C7 posterior disc
osteophytes with resultant mild bony spinal canal narrowing.

Upper chest: No consolidation within the imaged lung apices.

Other: Deep to the right posterior neck skin surface marker, there
is a well-circumscribed fat density mass within the paraspinal
musculature of the posterior neck and upper back. The mass is
present at midline and to the right of midline and measures 1.8 x
4.0 x 9.3 cm (AP x TV x CC) (series 2, image 52) (series 12, image
62). The mass spans the C3-T2 levels and the imaging features are
consistent with lipoma.
IMPRESSION: 1.8 x 4.0 x 9.3 cm (AP x TV x CC) lipoma within the paraspinal
musculature at midline and to the right of midline within the
posterior neck and upper back. The mass spans the C3-T2 levels.

C5-C6 and C6-C7 posterior disc osteophytes with resultant mild bony
spinal canal narrowing.

## 2020-12-26 DIAGNOSIS — F411 Generalized anxiety disorder: Secondary | ICD-10-CM | POA: Diagnosis not present

## 2020-12-26 DIAGNOSIS — F339 Major depressive disorder, recurrent, unspecified: Secondary | ICD-10-CM | POA: Diagnosis not present

## 2020-12-26 DIAGNOSIS — Z639 Problem related to primary support group, unspecified: Secondary | ICD-10-CM | POA: Diagnosis not present

## 2021-01-07 DIAGNOSIS — Z639 Problem related to primary support group, unspecified: Secondary | ICD-10-CM | POA: Diagnosis not present

## 2021-01-07 DIAGNOSIS — F339 Major depressive disorder, recurrent, unspecified: Secondary | ICD-10-CM | POA: Diagnosis not present

## 2021-01-07 DIAGNOSIS — F411 Generalized anxiety disorder: Secondary | ICD-10-CM | POA: Diagnosis not present
